# Patient Record
Sex: Female | Born: 1970 | Race: White | Hispanic: No | Smoking: Current some day smoker
Health system: Southern US, Community
[De-identification: ages and names within clinical notes are randomized; demographics above are authoritative.]

## PROBLEM LIST (undated history)

## (undated) DIAGNOSIS — G2581 Restless legs syndrome: Secondary | ICD-10-CM

## (undated) DIAGNOSIS — R471 Dysarthria and anarthria: Secondary | ICD-10-CM

## (undated) DIAGNOSIS — I10 Essential (primary) hypertension: Secondary | ICD-10-CM

## (undated) DIAGNOSIS — D329 Benign neoplasm of meninges, unspecified: Secondary | ICD-10-CM

## (undated) DIAGNOSIS — D332 Benign neoplasm of brain, unspecified: Secondary | ICD-10-CM

## (undated) DIAGNOSIS — E7800 Pure hypercholesterolemia, unspecified: Secondary | ICD-10-CM

## (undated) DIAGNOSIS — F4310 Post-traumatic stress disorder, unspecified: Secondary | ICD-10-CM

## (undated) DIAGNOSIS — F4000 Agoraphobia, unspecified: Secondary | ICD-10-CM

## (undated) DIAGNOSIS — E119 Type 2 diabetes mellitus without complications: Secondary | ICD-10-CM

## (undated) DIAGNOSIS — F410 Panic disorder [episodic paroxysmal anxiety] without agoraphobia: Secondary | ICD-10-CM

## (undated) DIAGNOSIS — F419 Anxiety disorder, unspecified: Secondary | ICD-10-CM

## (undated) DIAGNOSIS — R42 Dizziness and giddiness: Secondary | ICD-10-CM

## (undated) HISTORY — DX: Dysarthria and anarthria: R47.1

## (undated) HISTORY — PX: BACK SURGERY: SHX140

## (undated) HISTORY — PX: PARTIAL HYSTERECTOMY: SHX80

## (undated) HISTORY — DX: Benign neoplasm of meninges, unspecified: D32.9

## (undated) HISTORY — DX: Dizziness and giddiness: R42

---

## 2001-08-06 ENCOUNTER — Emergency Department (HOSPITAL_COMMUNITY): Admission: EM | Admit: 2001-08-06 | Discharge: 2001-08-06 | Payer: Self-pay | Admitting: Emergency Medicine

## 2001-11-04 ENCOUNTER — Encounter: Payer: Self-pay | Admitting: Emergency Medicine

## 2001-11-04 ENCOUNTER — Emergency Department (HOSPITAL_COMMUNITY): Admission: EM | Admit: 2001-11-04 | Discharge: 2001-11-04 | Payer: Self-pay | Admitting: Emergency Medicine

## 2002-05-20 ENCOUNTER — Emergency Department (HOSPITAL_COMMUNITY): Admission: EM | Admit: 2002-05-20 | Discharge: 2002-05-20 | Payer: Self-pay | Admitting: Emergency Medicine

## 2003-06-16 ENCOUNTER — Emergency Department (HOSPITAL_COMMUNITY): Admission: EM | Admit: 2003-06-16 | Discharge: 2003-06-16 | Payer: Self-pay | Admitting: Emergency Medicine

## 2003-06-19 ENCOUNTER — Ambulatory Visit (HOSPITAL_COMMUNITY): Admission: RE | Admit: 2003-06-19 | Discharge: 2003-06-19 | Payer: Self-pay | Admitting: Family Medicine

## 2004-05-19 ENCOUNTER — Emergency Department (HOSPITAL_COMMUNITY): Admission: EM | Admit: 2004-05-19 | Discharge: 2004-05-19 | Payer: Self-pay | Admitting: Emergency Medicine

## 2005-04-14 ENCOUNTER — Emergency Department (HOSPITAL_COMMUNITY): Admission: EM | Admit: 2005-04-14 | Discharge: 2005-04-14 | Payer: Self-pay | Admitting: Emergency Medicine

## 2005-05-05 ENCOUNTER — Emergency Department (HOSPITAL_COMMUNITY): Admission: EM | Admit: 2005-05-05 | Discharge: 2005-05-05 | Payer: Self-pay | Admitting: Emergency Medicine

## 2005-08-30 ENCOUNTER — Other Ambulatory Visit: Admission: RE | Admit: 2005-08-30 | Discharge: 2005-08-30 | Payer: Self-pay | Admitting: Obstetrics & Gynecology

## 2013-04-30 ENCOUNTER — Encounter (HOSPITAL_COMMUNITY): Payer: Self-pay | Admitting: Emergency Medicine

## 2013-04-30 ENCOUNTER — Emergency Department (HOSPITAL_COMMUNITY)
Admission: EM | Admit: 2013-04-30 | Discharge: 2013-04-30 | Disposition: A | Discharge status: Home / Self Care | Payer: BC Managed Care – PPO | Attending: Emergency Medicine | Admitting: Emergency Medicine

## 2013-04-30 DIAGNOSIS — IMO0002 Reserved for concepts with insufficient information to code with codable children: Secondary | ICD-10-CM | POA: Insufficient documentation

## 2013-04-30 DIAGNOSIS — Y92410 Unspecified street and highway as the place of occurrence of the external cause: Secondary | ICD-10-CM | POA: Insufficient documentation

## 2013-04-30 DIAGNOSIS — Y9389 Activity, other specified: Secondary | ICD-10-CM | POA: Insufficient documentation

## 2013-04-30 DIAGNOSIS — M549 Dorsalgia, unspecified: Secondary | ICD-10-CM

## 2013-04-30 HISTORY — DX: Agoraphobia, unspecified: F40.00

## 2013-04-30 HISTORY — DX: Post-traumatic stress disorder, unspecified: F43.10

## 2013-04-30 HISTORY — DX: Type 2 diabetes mellitus without complications: E11.9

## 2013-04-30 HISTORY — DX: Essential (primary) hypertension: I10

## 2013-04-30 HISTORY — DX: Benign neoplasm of brain, unspecified: D33.2

## 2013-04-30 HISTORY — DX: Panic disorder (episodic paroxysmal anxiety): F41.0

## 2013-04-30 HISTORY — DX: Pure hypercholesterolemia, unspecified: E78.00

## 2013-04-30 HISTORY — DX: Restless legs syndrome: G25.81

## 2013-04-30 HISTORY — DX: Anxiety disorder, unspecified: F41.9

## 2013-04-30 MED ORDER — METHOCARBAMOL 500 MG PO TABS: 500.0000 mg | ORAL_TABLET | Freq: Two times a day (BID) | ORAL | Status: DC

## 2013-04-30 MED ORDER — NAPROXEN 500 MG PO TABS: 500.0000 mg | ORAL_TABLET | Freq: Two times a day (BID) | ORAL | Status: DC

## 2013-04-30 NOTE — Progress Notes (Signed)
[facility] provided pt with a list of primary care resources.  °
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
## 2013-05-03 NOTE — ED Provider Notes (Signed)
Medical screening examination/treatment/procedure(s) were performed by non-physician practitioner and as supervising physician I was immediately available for consultation/collaboration.  EKG Interpretation   None         Rey Fors E Brieanna Nau, MD 05/03/13 0904 

## 2013-12-13 ENCOUNTER — Emergency Department (HOSPITAL_COMMUNITY): Payer: Medicare HMO

## 2013-12-13 ENCOUNTER — Emergency Department (HOSPITAL_COMMUNITY)
Admission: EM | Admit: 2013-12-13 | Discharge: 2013-12-13 | Disposition: A | Discharge status: Home / Self Care | Payer: Medicare HMO | Attending: Emergency Medicine | Admitting: Emergency Medicine

## 2013-12-13 ENCOUNTER — Encounter (HOSPITAL_COMMUNITY): Payer: Self-pay | Admitting: Emergency Medicine

## 2013-12-13 DIAGNOSIS — Z86011 Personal history of benign neoplasm of the brain: Secondary | ICD-10-CM | POA: Insufficient documentation

## 2013-12-13 DIAGNOSIS — R000 Tachycardia, unspecified: Secondary | ICD-10-CM | POA: Insufficient documentation

## 2013-12-13 DIAGNOSIS — F411 Generalized anxiety disorder: Secondary | ICD-10-CM | POA: Insufficient documentation

## 2013-12-13 DIAGNOSIS — Z79899 Other long term (current) drug therapy: Secondary | ICD-10-CM | POA: Insufficient documentation

## 2013-12-13 DIAGNOSIS — Z3202 Encounter for pregnancy test, result negative: Secondary | ICD-10-CM | POA: Insufficient documentation

## 2013-12-13 DIAGNOSIS — R002 Palpitations: Secondary | ICD-10-CM | POA: Insufficient documentation

## 2013-12-13 DIAGNOSIS — E119 Type 2 diabetes mellitus without complications: Secondary | ICD-10-CM | POA: Insufficient documentation

## 2013-12-13 DIAGNOSIS — R079 Chest pain, unspecified: Secondary | ICD-10-CM | POA: Insufficient documentation

## 2013-12-13 DIAGNOSIS — I10 Essential (primary) hypertension: Secondary | ICD-10-CM | POA: Insufficient documentation

## 2013-12-13 LAB — BASIC METABOLIC PANEL
ANION GAP: 15 (ref 5–15)
BUN: 11 mg/dL (ref 6–23)
CHLORIDE: 100 meq/L (ref 96–112)
CO2: 23 mEq/L (ref 19–32)
CREATININE: 0.63 mg/dL (ref 0.50–1.10)
Calcium: 9.8 mg/dL (ref 8.4–10.5)
GFR calc non Af Amer: >90 mL/min (ref >90)
Glucose, Bld: 272 mg/dL — ABNORMAL HIGH (ref 70–99)
Potassium: 3.4 mEq/L — ABNORMAL LOW (ref 3.7–5.3)
SODIUM: 138 meq/L (ref 137–147)

## 2013-12-13 LAB — POC URINE PREG, ED: Preg Test, Ur: NEGATIVE

## 2013-12-13 LAB — CBC
HCT: 41.8 % (ref 36.0–46.0)
Hemoglobin: 14.2 g/dL (ref 12.0–15.0)
MCH: 29.5 pg (ref 26.0–34.0)
MCHC: 34 g/dL (ref 30.0–36.0)
MCV: 86.9 fL (ref 78.0–100.0)
Platelets: 222 10*3/uL (ref 150–400)
RBC: 4.81 MIL/uL (ref 3.87–5.11)
RDW: 13.3 % (ref 11.5–15.5)
WBC: 10.8 10*3/uL — AB (ref 4.0–10.5)

## 2013-12-13 NOTE — ED Notes (Addendum)
RN went to D/C pt, and pt had multiple questions for the doctor. MD made aware.

## 2013-12-13 NOTE — ED Provider Notes (Signed)
CSN: [id number]     Arrival date & time 12/13/13  1536 History   First MD Initiated Contact with Patient 12/13/13 1601     Chief Complaint  Patient presents with  . Tachycardia     (Consider location/radiation/quality/duration/timing/severity/associated sxs/prior Treatment) Patient is a 43 y.o. female presenting with palpitations. The history is provided by the patient.  Palpitations Palpitations quality:  Irregular Onset quality:  Sudden Timing:  Constant Progression:  Unchanged Chronicity:  Chronic Context: not caffeine   Relieved by:  Nothing Worsened by:  Nothing tried Associated symptoms: chest pain (lasting seconds, occasional, L sided, sharp, mild SOB at that time also)   Associated symptoms: no cough, no diaphoresis, no dizziness, no near-syncope, no shortness of breath and no vomiting     Past Medical History  Diagnosis Date  . Diabetes mellitus without complication   . Hypertension   . High cholesterol   . PTSD (post-traumatic stress disorder)   . Agoraphobia   . Panic disorder   . Anxiety   . Brain tumor (benign)   . Restless leg syndrome    History reviewed. No pertinent past surgical history. Family History  Problem Relation Age of Onset  . Cancer Mother   . Hypertension Mother   . Diabetes Mother   . Glaucoma Father   . Heart failure Father    History  Substance Use Topics  . Smoking status: Never Smoker   . Smokeless tobacco: Never Used  . Alcohol Use: No   OB History   Grav Para Term Preterm Abortions TAB SAB Ect Mult Living                 Review of Systems  Constitutional: Negative for fever, chills and diaphoresis.  Respiratory: Negative for cough and shortness of breath.   Cardiovascular: Positive for chest pain (lasting seconds, occasional, L sided, sharp, mild SOB at that time also) and palpitations. Negative for near-syncope.  Gastrointestinal: Negative for vomiting.  Neurological: Negative for dizziness.  All other systems reviewed  and are negative.     Allergies  Review of patient's allergies indicates no known allergies.  Home Medications   Prior to Admission medications   Medication Sig Start Date End Date Taking? Authorizing Provider  atenolol (TENORMIN) 50 MG tablet Take 50 mg by mouth daily.    Historical Provider, MD  carbamazepine (TEGRETOL) 200 MG tablet Take 200 mg by mouth at bedtime.    Historical Provider, MD  glipiZIDE (GLUCOTROL XL) 2.5 MG 24 hr tablet Take 2.5 mg by mouth daily with breakfast.    Historical Provider, MD  ibuprofen (ADVIL,MOTRIN) 200 MG tablet Take 400 mg by mouth every 6 (six) hours as needed.    Historical Provider, MD  lisinopril (PRINIVIL,ZESTRIL) 5 MG tablet Take 5 mg by mouth daily.    Historical Provider, MD  metFORMIN (GLUCOPHAGE) 1000 MG tablet Take 1,000 mg by mouth 2 (two) times daily with a meal.    Historical Provider, MD   BP 174/83  Pulse 107  Temp(Src) 98.7 F (37.1 C) (Oral)  Resp 18  SpO2 97%  LMP 12/09/2013 Physical Exam  Nursing note and vitals reviewed. Constitutional: She is oriented to person, place, and time. She appears well-developed and well-nourished. No distress.  HENT:  Head: Normocephalic and atraumatic.  Eyes: EOM are normal. Pupils are equal, round, and reactive to light.  Neck: Normal range of motion. Neck supple.  Cardiovascular: Regular rhythm.  Tachycardia present.  Exam reveals no friction rub.   No  murmur heard. Pulmonary/Chest: Effort normal and breath sounds normal. No respiratory distress. She has no wheezes. She has no rales.  Abdominal: Soft. She exhibits no distension. There is no tenderness. There is no rebound.  Musculoskeletal: Normal range of motion. She exhibits no edema.  Neurological: She is alert and oriented to person, place, and time.  Skin: She is not diaphoretic.    ED Course  Procedures (including critical care time) Labs Review Labs Reviewed  CBC - Abnormal; Notable for the following:    WBC 10.8 (*)    All  other components within normal limits  BASIC METABOLIC PANEL  I-STAT TROPOININ, ED  POC URINE PREG, ED    Imaging Review Dg Chest 2 View  12/13/2013   CLINICAL DATA:  Chest pain.  EXAM: CHEST  2 VIEW  COMPARISON:  Prior radiograph from 08/04/2010  FINDINGS: The cardiac and mediastinal silhouettes are stable in size and contour, and remain within normal limits.  The lungs are normally inflated. No airspace consolidation, pleural effusion, or pulmonary edema is identified. There is no pneumothorax.  No acute osseous abnormality identified.  IMPRESSION: No active cardiopulmonary disease.   Electronically Signed   By: Jeannine Boga M.D.   On: 12/13/2013 17:41     EKG Interpretation   Date/Time:  Thursday December 13 2013 15:49:45 EDT Ventricular Rate:  111 PR Interval:  156 QRS Duration: 74 QT Interval:  352 QTC Calculation: 478 R Axis:   15 Text Interpretation:  Sinus tachycardia Nonspecific T abnrm, anterolateral  leads Similar to prior Confirmed by Mingo Amber  MD, [place] ([id number]) on 12/13/2013  4:03:05 PM      MDM   Final diagnoses:  Tachycardia    43 year old female with history of A. fib, multiple psychiatric disorders, hypertension, diabetes presents with palpitations. Present today. States some mild disorientation during this time. No dizziness or ataxia. No nausea or vomiting. Has a few episodes of chest pain, lasting seconds with mild first of breath associated that time. Irregular bouts of chest pain. Has been off of her meds, atenolol, all her diabetes meds for about 9 months. EKG with sinus tach on the monitor in sinus rhythm. Denies any caffeine, was a drug use, stimulants. She is nervous in the room, but hasn't says she does not want anything for anxiety. Vitals otherwise stable. With her fleeting chest pain, this is not consistent with PE. No persistent chest pain or shortness of breath. Not on estrogen. Will check basic labs, chest x-ray.  Labs ok. CXR ok. Stable for  discharge, given resource guide to establish a PCP.  Osvaldo Shipper, MD 12/13/13 [id number]

## 2013-12-13 NOTE — ED Notes (Signed)
Per pt, states she can feel heart beat hitting "back of chest"-states she has been diagnosed with HTN, Hyperlipidemia and not currently taking meds

## 2013-12-13 NOTE — Discharge Instructions (Signed)
Nonspecific Tachycardia Tachycardia is a faster than normal heartbeat (more than 100 beats per minute). In adults, the heart normally beats between 60 and 100 times a minute. A fast heartbeat may be a normal response to exercise or stress. It does not necessarily mean that something is wrong. However, sometimes when your heart beats too fast it may not be able to pump enough blood to the rest of your body. This can result in chest pain, shortness of breath, dizziness, and even fainting. Nonspecific tachycardia means that the specific cause or pattern of your tachycardia is unknown. CAUSES  Tachycardia may be harmless or it may be due to a more serious underlying cause. Possible causes of tachycardia include:  Exercise or exertion.  Fever.  Pain or injury.  Infection.  Loss of body fluids (dehydration).  Overactive thyroid.  Lack of red blood cells (anemia).  Anxiety and stress.  Alcohol.  Caffeine.  Tobacco products.  Diet pills.  Illegal drugs.  Heart disease. SYMPTOMS  Rapid or irregular heartbeat (palpitations).  Suddenly feeling your heart beating (cardiac awareness).  Dizziness.  Tiredness (fatigue).  Shortness of breath.  Chest pain.  Nausea.  Fainting. DIAGNOSIS  Your caregiver will perform a physical exam and take your medical history. In some cases, a heart specialist (cardiologist) may be consulted. Your caregiver may also order:  Blood tests.  Electrocardiography. This test records the electrical activity of your heart.  A heart monitoring test. TREATMENT  Treatment will depend on the likely cause of your tachycardia. The goal is to treat the underlying cause of your tachycardia. Treatment methods may include:  Replacement of fluids or blood through an intravenous (IV) tube for moderate to severe dehydration or anemia.  New medicines or changes in your current medicines.  Diet and lifestyle changes.  Treatment for certain  infections.  Stress relief or relaxation methods. HOME CARE INSTRUCTIONS   Rest.  Drink enough fluids to keep your urine clear or pale yellow.  Do not smoke.  Avoid:  Caffeine.  Tobacco.  Alcohol.  Chocolate.  Stimulants such as over-the-counter diet pills or pills that help you stay awake.  Situations that cause anxiety or stress.  Illegal drugs such as marijuana, phencyclidine (PCP), and cocaine.  Only take medicine as directed by your caregiver.  Keep all follow-up appointments as directed by your caregiver. SEEK IMMEDIATE MEDICAL CARE IF:   You have pain in your chest, upper arms, jaw, or neck.  You become weak, dizzy, or feel faint.  You have palpitations that will not go away.  You vomit, have diarrhea, or pass blood in your stool.  Your skin is cool, pale, and wet.  You have a fever that will not go away with rest, fluids, and medicine. MAKE SURE YOU:   Understand these instructions.  Will watch your condition.  Will get help right away if you are not doing well or get worse. Document Released: 06/24/2004 Document Revised: 08/09/2011 Document Reviewed: 04/27/2011 [facility] Patient Information 2015 [place], [place]. This information is not intended to replace advice given to you by your health care provider. Make sure you discuss any questions you have with your health care provider.   Emergency Department Resource Guide 1) Find a Doctor and Pay Out of Pocket Although you won't have to find out who is covered by your insurance plan, it is a good idea to ask around and get recommendations. You will then need to call the office and see if the doctor you have chosen will accept  you as a new patient and what types of options they offer for patients who are self-pay. Some doctors offer discounts or will set up payment plans for their patients who do not have insurance, but you will need to ask so you aren't surprised when you get to your appointment.  2)  Contact Your Local Health Department Not all health departments have doctors that can see patients for sick visits, but many do, so it is worth a call to see if yours does. If you don't know where your local health department is, you can check in your phone book. The CDC also has a tool to help you locate your state's health department, and many state websites also have listings of all of their local health departments.  3) Find a [facility] If your illness is not likely to be very severe or complicated, you may want to try a walk in clinic. These are popping up all over the country in pharmacies, drugstores, and shopping centers. They're usually staffed by nurse practitioners or physician assistants that have been trained to treat common illnesses and complaints. They're usually fairly quick and inexpensive. However, if you have serious medical issues or chronic medical problems, these are probably not your best option.  No Primary Care Doctor: - Call Health Connect at  [phone] - they can help you locate a primary care doctor that  accepts your insurance, provides certain services, etc. - Physician Referral Service- [phone]  Chronic Pain Problems: Organization         Address  Phone   Notes  [facility]  [phone] Patients need to be referred by their primary care doctor.   Medication Assistance: Organization         Address  Phone   Notes  [facility] Medication [facility] [address] [place], [place] [address] [phone] --Must be a resident of [facility] -- Must have NO insurance coverage whatsoever (no Medicaid/ Medicare, etc.) -- The pt. MUST have a primary care doctor that directs their care regularly and follows them in the community   MedAssist  [phone]   [employer]  3674707209    Agencies that provide inexpensive medical care: Organization         Address  Phone   Notes  [place]   [phone]   [facility]    [phone]   [facility] [place], [place] [address] [phone]   [place] [address] [place] [phone]   Planned Parenthood    [phone]   [facility]    [phone]   [place] and [place] [address], [place] Phone:  [phone], Fax:  [phone] Hours of Operation:  9 am - 6 pm, M-F.  Also accepts Medicaid/Medicare and self-pay.  [facility] for [address] [place], [address], [place] Phone: [phone], Fax: [phone]. Hours of Operation:  8:30 am - 5:30 pm, M-F.  Also accepts Medicaid and self-pay.  [facility] [place] [address] [place] Phone: [phone]   [place], [place], [place] [phone], Ext. [phone] Mondays & Thursdays: 7-9 AM.  First 15 patients are seen on a first come, first serve basis.    [place] Providers:  Organization  Address  Phone   Notes  [facility] [address], [place] [phone] Also accepts self-pay patients.  [facility] [address], [place]  [phone]   [place], [address], [place] [phone]   [facility] [address], [place] [phone]   Lucianne Lei [address] [place] [address], [place]   [phone] Only accepts [place] Access [place] patients after they have their name applied to their card.   Self-Pay (no insurance) in [facility]:  Organization         Address  Phone   Notes  Sickle Cell Patients, [facility], [facility], [facility] [place] [phone]   [facility] [place] [phone]   [facility] [place]  [place], [place], [place] [phone]   [facility]/Dr. Osei-Bonsu  [address], [place] or [address], [place] [phone] Phone number for both [place] and [place] locations is the same.  Urgent Medical and [facility] [address], [place] [phone]   [facility] [address], [place] or [address] [phone]   [facility] [place] [address], [place] [phone], phone; [phone], fax Sees patients 1st and 3rd Saturday of every month.  Must not qualify for public or private insurance (i.e. Medicaid, Medicare, [place] Health Choice, Veterans' Benefits)  Household income should be no more than 200% of the poverty level The clinic cannot treat you if you are pregnant or think you are pregnant  Sexually transmitted diseases are not treated at the clinic.    Dental Care: Organization         Address  Phone  Notes  [facility] Department of [facility] [place] [phone] Accepts children up to age 37 who are enrolled in [place] or [place]; pregnant women with a Medicaid card; and children who have applied for Medicaid or [place] Health Choice, but were declined, whose parents can pay a reduced fee at time of service.  [facility] Department of [facility]  [address], [place] [phone] Accepts children up to age 64 who are enrolled in [place] or [place]; pregnant women with a Medicaid card; and children who have applied for Medicaid or [place] Health Choice, but were declined, whose parents can pay a reduced fee at time of service.  [facility] Adult [facility] Access PROGRAM  [place] [phone] Patients are seen by appointment only. Walk-ins are not accepted. [place] will see patients 44 years of age and older. Monday - Tuesday  (8am-5pm) Most Wednesdays (8:30-5pm) $30 per visit, cash only  [facility] Adult Dental Access PROGRAM  [address], [facility] [phone] Patients are seen by appointment only. Walk-ins are not accepted. [place] will see patients 48 years of age and older. One Wednesday Evening (Monthly: Volunteer Based).  $30 per visit, cash only  [place]  [phone] for adults; Children under age 65, call Graduate Pediatric Dentistry at [phone]. Children aged 53-14, please call [phone] to request a pediatric application.  Dental services are provided in all areas of dental care including  fillings, crowns and bridges, complete and partial dentures, implants, gum treatment, root canals, and extractions. Preventive care is also provided. Treatment is provided to both adults and children. Patients are selected via a lottery and there is often a waiting list.   [facility] [address], [place]  [phone] [email or web address]   [facility] [address] [place], [place] [phone], Ext. [phone] Second and Fourth Thursday of each month, opens at 6:30 AM; Clinic ends at 9 AM.  Patients are seen on a first-come first-served basis, and a limited number are seen during each clinic.   [facility]  [address] Hillard Danker [place], [place] [phone]   Eligibility Requirements You must have lived in [place], [place], or [place] counties for at least the last three months.   You cannot be eligible for state or federal sponsored [employer], including [employer], [place], or [employer].   You generally cannot be eligible for healthcare insurance through your employer.    How to apply: Eligibility screenings are held every Tuesday and Wednesday afternoon from 1:00 pm until 4:00 pm. You do not need an appointment for the interview!  [facility] [address], [place], [place]   [place]  [place] Department  [place]  [phone]    Behavioral Health Resources in the Community: Intensive Outpatient Programs Organization         Address  Phone  Notes  [place]. [address] [place], [place] [phone]   [facility] Outpatient [address], [place], [place]   ADS: Alcohol & Drug Svcs [address] [place], [place]   [place] [address]  [place], [place] or [phone]   Substance Abuse Resources Organization         Address  Phone  Notes  Alcohol and Drug Services  [phone]   [place]  [phone]   [place]   Chinita Pester  [phone]   Residential & Outpatient Substance Abuse Program  [phone]   Psychological Services Organization         Address  Phone  Notes  [facility] [place]  [place]  [phone]   [place] [address] [place] or [phone]    Mobile Crisis Teams Organization         Address  Phone  Notes  [facility], Mobile Crisis Care Unit  [phone]   [facility]ive Psychotherapeutic Services  [address] [place], [place]   [place] [address], [place] [phone]    Self-Help/Support Groups Organization         Address  Phone             Notes  [place]. of [place] - variety of support groups  [place] Call for more information  Narcotics Anonymous (NA), [facility] [address], [employer] [place]  2 meetings at this location   Special educational needs teacher         Address  Phone  Notes  ASAP Residential Treatment [place],    [place]  [place]  [address], [place] [phone], [place], [place]   [place],  [place] [phone] Admissions: 8am-3pm M-F  Incentives Substance [place] [address]    [place], [place] [phone]   [facility] [address] [place], [place], [place]   [facility] [address]  [place], [place]   [facility]ight Programs - Intensive Outpatient [address] Kristeen [address], [place], [place]   [facility] ([place].) [place].,  [place], [place] [phone] or [phone]   [facility]idential Treatment Services (RTS) [address] [place], [place] Accepts Medicaid  [facility] [place] [address]  [place] [phone] Substance Abuse/Addiction Treatment   [facility] Organization         Address  Phone  Notes  [facility]  [phone]   Domenic Schwab, PhD [address] Arlis Porta [place], [place]   [phone] or [phone]   [place], [place] [phone]   [facility] [address], [place], [place] [phone] Insurance/Medicaid/sponsorship through [facility] and Families [address]                                    [place], [place] [phone] [place] [address], [place] [phone]    Dr. Adele Schilder  [phone]   [facility] Dept. 1) [address] [place] 2) [place] 3)  [place] [address], [place] [phone]  [phone]   [place] [phone] or [phone] (After Hours)

## 2013-12-14 LAB — I-STAT TROPONIN, ED: Troponin i, poc: 0.02 ng/mL (ref 0.00–0.08)

## 2014-10-05 ENCOUNTER — Emergency Department (INDEPENDENT_AMBULATORY_CARE_PROVIDER_SITE_OTHER)
Admission: EM | Admit: 2014-10-05 | Discharge: 2014-10-05 | Disposition: A | Discharge status: Home / Self Care | Payer: Commercial Managed Care - HMO | Source: Home / Self Care | Attending: Family Medicine | Admitting: Family Medicine

## 2014-10-05 DIAGNOSIS — J069 Acute upper respiratory infection, unspecified: Secondary | ICD-10-CM | POA: Diagnosis not present

## 2014-10-05 DIAGNOSIS — B9789 Other viral agents as the cause of diseases classified elsewhere: Principal | ICD-10-CM

## 2014-10-05 MED ORDER — BENZONATATE 100 MG PO CAPS: 100.0000 mg | ORAL_CAPSULE | Freq: Three times a day (TID) | ORAL | Status: DC | PRN

## 2014-10-05 MED ORDER — LEVOCETIRIZINE DIHYDROCHLORIDE 5 MG PO TABS: 5.0000 mg | ORAL_TABLET | Freq: Every evening | ORAL | Status: DC

## 2014-10-05 MED ORDER — IBUPROFEN 600 MG PO TABS: 600.0000 mg | ORAL_TABLET | Freq: Three times a day (TID) | ORAL | Status: DC | PRN

## 2014-10-05 NOTE — ED Provider Notes (Signed)
CSN: [id number]     Arrival date & time 10/05/14  1517 History   First MD Initiated Contact with Patient 10/05/14 1607     Chief Complaint  Patient presents with  . Recurrent Sinusitis  . Nasal Congestion    HPI   Diamond Brown is a 44 y.o. female complaining of nasal blockage that started 7 days ago.  Associated symptoms include runny nose, cough, sore throat and headache today, and she denies fever, sore throat and difficulty breathing.The patient symptoms are worsening. Treatments tried thus far include cough suppressant of choice with fair  relief. She has also tired Claritin with mild relief. She reports sick contacts at her work.  She has had her flu shot. She denies a history of asthma.    Past Medical History  Diagnosis Date  . Diabetes mellitus without complication   . Hypertension   . High cholesterol   . PTSD (post-traumatic stress disorder)   . Agoraphobia   . Panic disorder   . Anxiety   . Brain tumor (benign)   . Restless leg syndrome    No past surgical history on file. Family History  Problem Relation Age of Onset  . Cancer Mother   . Hypertension Mother   . Diabetes Mother   . Glaucoma Father   . Heart failure Father    History  Substance Use Topics  . Smoking status: Never Smoker   . Smokeless tobacco: Never Used  . Alcohol Use: No   OB History    No data available     Review of Systems  Constitutional: Negative for chills, diaphoresis, activity change, appetite change and fatigue.  HENT: Positive for congestion, sneezing and sore throat. Negative for ear discharge, facial swelling, hearing loss, sinus pressure, trouble swallowing and voice change.   Cardiovascular: Negative for chest pain.  Gastrointestinal: Negative for abdominal distention.  Genitourinary: Negative for flank pain.  Musculoskeletal: Positive for myalgias.  Neurological: Negative for dizziness.    Allergies  Review of patient's allergies indicates no known  allergies.  Home Medications   Prior to Admission medications   Medication Sig Start Date End Date Taking? Authorizing Provider  atenolol (TENORMIN) 50 MG tablet Take 50 mg by mouth daily.    Historical Provider, MD  benzonatate (TESSALON) 100 MG capsule Take 1-2 capsules (100-200 mg total) by mouth 3 (three) times daily as needed for cough. 10/05/14   Tereasa Coop, PA-C  bimatoprost (LUMIGAN) 0.03 % ophthalmic solution Place 1 drop into both eyes at bedtime.    Historical Provider, MD  brimonidine-timolol (COMBIGAN) 0.2-0.5 % ophthalmic solution Place 1 drop into both eyes every 12 (twelve) hours.    Historical Provider, MD  carbamazepine (TEGRETOL) 200 MG tablet Take 200 mg by mouth at bedtime.    Historical Provider, MD  citalopram (CELEXA) 40 MG tablet Take 40 mg by mouth daily.    Historical Provider, MD  haloperidol (HALDOL) 1 MG tablet Take 1 mg by mouth daily.    Historical Provider, MD  ibuprofen (ADVIL,MOTRIN) 600 MG tablet Take 1 tablet (600 mg total) by mouth every 8 (eight) hours as needed for fever, headache or moderate pain. 10/05/14   Tereasa Coop, PA-C  levocetirizine (XYZAL) 5 MG tablet Take 1 tablet (5 mg total) by mouth every evening. 10/05/14   Tereasa Coop, PA-C  metFORMIN (GLUCOPHAGE) 1000 MG tablet Take 1,000 mg by mouth 2 (two) times daily with a meal.    Historical Provider, MD   BP  140/94 mmHg  Pulse 84  Temp(Src) 99.4 F (37.4 C) (Oral)  Resp 16  SpO2 95% Physical Exam  Constitutional: She is oriented to person, place, and time. She appears well-developed and well-nourished.  HENT:  Head: Normocephalic.  Right Ear: External ear normal.  Left Ear: External ear normal.  Mouth/Throat: No oropharyngeal exudate.  Eyes: Conjunctivae and EOM are normal. Pupils are equal, round, and reactive to light. Right eye exhibits no discharge. Left eye exhibits no discharge.  Neck: No tracheal deviation present.  Cardiovascular: Normal rate, regular rhythm and normal  heart sounds.   Pulmonary/Chest: Effort normal and breath sounds normal.  Abdominal: Soft. Bowel sounds are normal.  Lymphadenopathy:    She has no cervical adenopathy.  Neurological: She is alert and oriented to person, place, and time. No cranial nerve deficit.  Skin: Skin is warm and dry.  Psychiatric: She has a normal mood and affect. Her behavior is normal. Judgment and thought content normal.  Nursing note and vitals reviewed.     ED Course  Procedures (including critical care time) Labs Review Labs Reviewed - No data to display  Imaging Review No results found.   MDM   1. Viral URI with cough    Diamond Brown is a never smoker female here today complaining of sore throat, sinus congestion, and cough present 7 days.  She feels overall that her symptoms are getting better.  Will treat for viral uri, allergies and cough.    Tereasa Coop, PA-C 10/05/14 [phone]

## 2014-10-05 NOTE — ED Notes (Signed)
Pt comes in with c/o recurrent sinusitis/bronchitis s/p Doxycycline treatment 2 weeks ago diagnosed in [facility] C/o freq cough with greenish phlegm, post nasal drainage and congestion moving to chest Denies chest pain Slight chills Pt has tried otc Dayquil, Cold/cough medication

## 2014-10-05 NOTE — ED Notes (Signed)
Diamond Brown , rn,had computer issues earlier, and used this Probation officer 's code to chart

## 2015-12-10 ENCOUNTER — Ambulatory Visit: Payer: Commercial Managed Care - HMO | Admitting: Podiatry

## 2015-12-24 ENCOUNTER — Ambulatory Visit: Payer: Commercial Managed Care - HMO | Admitting: Podiatry

## 2016-04-07 DIAGNOSIS — E782 Mixed hyperlipidemia: Secondary | ICD-10-CM | POA: Diagnosis not present

## 2016-04-07 DIAGNOSIS — E1165 Type 2 diabetes mellitus with hyperglycemia: Secondary | ICD-10-CM | POA: Diagnosis not present

## 2016-04-14 DIAGNOSIS — I10 Essential (primary) hypertension: Secondary | ICD-10-CM | POA: Diagnosis not present

## 2016-04-14 DIAGNOSIS — E782 Mixed hyperlipidemia: Secondary | ICD-10-CM | POA: Diagnosis not present

## 2016-04-14 DIAGNOSIS — E1165 Type 2 diabetes mellitus with hyperglycemia: Secondary | ICD-10-CM | POA: Diagnosis not present

## 2016-04-14 DIAGNOSIS — Z23 Encounter for immunization: Secondary | ICD-10-CM | POA: Diagnosis not present

## 2016-04-15 DIAGNOSIS — H401131 Primary open-angle glaucoma, bilateral, mild stage: Secondary | ICD-10-CM | POA: Diagnosis not present

## 2016-04-15 DIAGNOSIS — H2511 Age-related nuclear cataract, right eye: Secondary | ICD-10-CM | POA: Diagnosis not present

## 2016-04-15 DIAGNOSIS — H02839 Dermatochalasis of unspecified eye, unspecified eyelid: Secondary | ICD-10-CM | POA: Diagnosis not present

## 2016-04-15 DIAGNOSIS — H527 Unspecified disorder of refraction: Secondary | ICD-10-CM | POA: Diagnosis not present

## 2016-05-05 DIAGNOSIS — Z01419 Encounter for gynecological examination (general) (routine) without abnormal findings: Secondary | ICD-10-CM | POA: Diagnosis not present

## 2016-05-05 DIAGNOSIS — N920 Excessive and frequent menstruation with regular cycle: Secondary | ICD-10-CM | POA: Diagnosis not present

## 2016-05-05 DIAGNOSIS — Z1231 Encounter for screening mammogram for malignant neoplasm of breast: Secondary | ICD-10-CM | POA: Diagnosis not present

## 2016-08-18 DIAGNOSIS — I10 Essential (primary) hypertension: Secondary | ICD-10-CM | POA: Diagnosis not present

## 2016-08-18 DIAGNOSIS — E782 Mixed hyperlipidemia: Secondary | ICD-10-CM | POA: Diagnosis not present

## 2016-08-18 DIAGNOSIS — E1165 Type 2 diabetes mellitus with hyperglycemia: Secondary | ICD-10-CM | POA: Diagnosis not present

## 2016-08-25 DIAGNOSIS — Z0000 Encounter for general adult medical examination without abnormal findings: Secondary | ICD-10-CM | POA: Diagnosis not present

## 2016-08-25 DIAGNOSIS — I10 Essential (primary) hypertension: Secondary | ICD-10-CM | POA: Diagnosis not present

## 2016-08-25 DIAGNOSIS — E782 Mixed hyperlipidemia: Secondary | ICD-10-CM | POA: Diagnosis not present

## 2016-08-25 DIAGNOSIS — E1165 Type 2 diabetes mellitus with hyperglycemia: Secondary | ICD-10-CM | POA: Diagnosis not present

## 2016-10-28 DIAGNOSIS — H401131 Primary open-angle glaucoma, bilateral, mild stage: Secondary | ICD-10-CM | POA: Diagnosis not present

## 2017-03-02 DIAGNOSIS — E1165 Type 2 diabetes mellitus with hyperglycemia: Secondary | ICD-10-CM | POA: Diagnosis not present

## 2017-03-02 DIAGNOSIS — E782 Mixed hyperlipidemia: Secondary | ICD-10-CM | POA: Diagnosis not present

## 2017-03-09 DIAGNOSIS — I10 Essential (primary) hypertension: Secondary | ICD-10-CM | POA: Diagnosis not present

## 2017-03-09 DIAGNOSIS — E1165 Type 2 diabetes mellitus with hyperglycemia: Secondary | ICD-10-CM | POA: Diagnosis not present

## 2017-03-09 DIAGNOSIS — Z23 Encounter for immunization: Secondary | ICD-10-CM | POA: Diagnosis not present

## 2017-03-09 DIAGNOSIS — E782 Mixed hyperlipidemia: Secondary | ICD-10-CM | POA: Diagnosis not present

## 2017-04-29 DIAGNOSIS — H401133 Primary open-angle glaucoma, bilateral, severe stage: Secondary | ICD-10-CM | POA: Diagnosis not present

## 2017-05-11 DIAGNOSIS — Z1231 Encounter for screening mammogram for malignant neoplasm of breast: Secondary | ICD-10-CM | POA: Diagnosis not present

## 2017-05-11 DIAGNOSIS — Z01419 Encounter for gynecological examination (general) (routine) without abnormal findings: Secondary | ICD-10-CM | POA: Diagnosis not present

## 2017-06-22 DIAGNOSIS — N390 Urinary tract infection, site not specified: Secondary | ICD-10-CM | POA: Diagnosis not present

## 2017-06-22 DIAGNOSIS — N926 Irregular menstruation, unspecified: Secondary | ICD-10-CM | POA: Diagnosis not present

## 2017-07-27 DIAGNOSIS — N920 Excessive and frequent menstruation with regular cycle: Secondary | ICD-10-CM | POA: Diagnosis not present

## 2017-08-31 DIAGNOSIS — E1165 Type 2 diabetes mellitus with hyperglycemia: Secondary | ICD-10-CM | POA: Diagnosis not present

## 2017-09-07 DIAGNOSIS — E782 Mixed hyperlipidemia: Secondary | ICD-10-CM | POA: Diagnosis not present

## 2017-09-07 DIAGNOSIS — D320 Benign neoplasm of cerebral meninges: Secondary | ICD-10-CM | POA: Diagnosis not present

## 2017-09-07 DIAGNOSIS — E1165 Type 2 diabetes mellitus with hyperglycemia: Secondary | ICD-10-CM | POA: Diagnosis not present

## 2017-09-07 DIAGNOSIS — I10 Essential (primary) hypertension: Secondary | ICD-10-CM | POA: Diagnosis not present

## 2017-09-07 DIAGNOSIS — Z0000 Encounter for general adult medical examination without abnormal findings: Secondary | ICD-10-CM | POA: Diagnosis not present

## 2017-09-08 ENCOUNTER — Other Ambulatory Visit: Payer: Self-pay | Admitting: Internal Medicine

## 2017-09-08 DIAGNOSIS — D320 Benign neoplasm of cerebral meninges: Secondary | ICD-10-CM

## 2017-09-26 ENCOUNTER — Ambulatory Visit
Admission: RE | Admit: 2017-09-26 | Discharge: 2017-09-26 | Disposition: A | Discharge status: Home / Self Care | Payer: Medicare Other | Source: Ambulatory Visit | Attending: Internal Medicine | Admitting: Internal Medicine

## 2017-09-26 ENCOUNTER — Other Ambulatory Visit: Payer: Commercial Managed Care - HMO

## 2017-09-26 DIAGNOSIS — D320 Benign neoplasm of cerebral meninges: Secondary | ICD-10-CM | POA: Diagnosis not present

## 2017-10-31 DIAGNOSIS — H401132 Primary open-angle glaucoma, bilateral, moderate stage: Secondary | ICD-10-CM | POA: Diagnosis not present

## 2018-03-09 ENCOUNTER — Other Ambulatory Visit: Payer: Self-pay

## 2018-03-09 NOTE — Patient Outreach (Signed)
[facility]) Care Management  03/09/2018  LATERIA ALDERMAN [birth year] [id number]   Medication Adherence call to Mrs. J[place] left a message for patient to call back patient is due on Lovastatin 40 mg under [place].   [place] Management Direct Dial [phone]  Fax [phone] [email or web address]

## 2018-03-22 DIAGNOSIS — E782 Mixed hyperlipidemia: Secondary | ICD-10-CM | POA: Diagnosis not present

## 2018-03-22 DIAGNOSIS — I10 Essential (primary) hypertension: Secondary | ICD-10-CM | POA: Diagnosis not present

## 2018-03-22 DIAGNOSIS — E1165 Type 2 diabetes mellitus with hyperglycemia: Secondary | ICD-10-CM | POA: Diagnosis not present

## 2018-03-29 DIAGNOSIS — Z23 Encounter for immunization: Secondary | ICD-10-CM | POA: Diagnosis not present

## 2018-03-29 DIAGNOSIS — E1165 Type 2 diabetes mellitus with hyperglycemia: Secondary | ICD-10-CM | POA: Diagnosis not present

## 2018-03-29 DIAGNOSIS — I10 Essential (primary) hypertension: Secondary | ICD-10-CM | POA: Diagnosis not present

## 2018-03-29 DIAGNOSIS — E782 Mixed hyperlipidemia: Secondary | ICD-10-CM | POA: Diagnosis not present

## 2018-05-05 DIAGNOSIS — H401132 Primary open-angle glaucoma, bilateral, moderate stage: Secondary | ICD-10-CM | POA: Diagnosis not present

## 2018-05-05 DIAGNOSIS — H2513 Age-related nuclear cataract, bilateral: Secondary | ICD-10-CM | POA: Diagnosis not present

## 2018-05-18 DIAGNOSIS — Z1231 Encounter for screening mammogram for malignant neoplasm of breast: Secondary | ICD-10-CM | POA: Diagnosis not present

## 2018-06-27 DIAGNOSIS — J069 Acute upper respiratory infection, unspecified: Secondary | ICD-10-CM | POA: Diagnosis not present

## 2018-06-27 DIAGNOSIS — E1165 Type 2 diabetes mellitus with hyperglycemia: Secondary | ICD-10-CM | POA: Diagnosis not present

## 2018-07-11 DIAGNOSIS — I10 Essential (primary) hypertension: Secondary | ICD-10-CM | POA: Diagnosis not present

## 2018-07-11 DIAGNOSIS — R110 Nausea: Secondary | ICD-10-CM | POA: Diagnosis not present

## 2018-07-11 DIAGNOSIS — R197 Diarrhea, unspecified: Secondary | ICD-10-CM | POA: Diagnosis not present

## 2018-07-12 DIAGNOSIS — R197 Diarrhea, unspecified: Secondary | ICD-10-CM | POA: Diagnosis not present

## 2018-07-14 ENCOUNTER — Other Ambulatory Visit: Payer: Self-pay | Admitting: Internal Medicine

## 2018-07-14 DIAGNOSIS — R109 Unspecified abdominal pain: Secondary | ICD-10-CM

## 2018-07-31 ENCOUNTER — Other Ambulatory Visit: Payer: Medicare Other

## 2018-08-07 ENCOUNTER — Ambulatory Visit
Admission: RE | Admit: 2018-08-07 | Discharge: 2018-08-07 | Disposition: A | Discharge status: Home / Self Care | Payer: Medicare Other | Source: Ambulatory Visit | Attending: Internal Medicine | Admitting: Internal Medicine

## 2018-08-07 DIAGNOSIS — K7689 Other specified diseases of liver: Secondary | ICD-10-CM | POA: Diagnosis not present

## 2018-08-07 DIAGNOSIS — N281 Cyst of kidney, acquired: Secondary | ICD-10-CM | POA: Diagnosis not present

## 2018-08-07 DIAGNOSIS — R109 Unspecified abdominal pain: Secondary | ICD-10-CM

## 2018-09-14 DIAGNOSIS — E1165 Type 2 diabetes mellitus with hyperglycemia: Secondary | ICD-10-CM | POA: Diagnosis not present

## 2018-09-21 DIAGNOSIS — D320 Benign neoplasm of cerebral meninges: Secondary | ICD-10-CM | POA: Diagnosis not present

## 2018-09-21 DIAGNOSIS — Z0000 Encounter for general adult medical examination without abnormal findings: Secondary | ICD-10-CM | POA: Diagnosis not present

## 2018-09-21 DIAGNOSIS — I10 Essential (primary) hypertension: Secondary | ICD-10-CM | POA: Diagnosis not present

## 2018-09-21 DIAGNOSIS — E1165 Type 2 diabetes mellitus with hyperglycemia: Secondary | ICD-10-CM | POA: Diagnosis not present

## 2018-09-21 DIAGNOSIS — E782 Mixed hyperlipidemia: Secondary | ICD-10-CM | POA: Diagnosis not present

## 2018-11-06 DIAGNOSIS — H401132 Primary open-angle glaucoma, bilateral, moderate stage: Secondary | ICD-10-CM | POA: Diagnosis not present

## 2018-11-06 DIAGNOSIS — H2513 Age-related nuclear cataract, bilateral: Secondary | ICD-10-CM | POA: Diagnosis not present

## 2018-11-06 DIAGNOSIS — H02839 Dermatochalasis of unspecified eye, unspecified eyelid: Secondary | ICD-10-CM | POA: Diagnosis not present

## 2018-11-06 DIAGNOSIS — H527 Unspecified disorder of refraction: Secondary | ICD-10-CM | POA: Diagnosis not present

## 2019-03-14 ENCOUNTER — Other Ambulatory Visit: Payer: Self-pay

## 2019-03-14 DIAGNOSIS — Z20822 Contact with and (suspected) exposure to covid-19: Secondary | ICD-10-CM

## 2019-03-15 ENCOUNTER — Encounter (HOSPITAL_COMMUNITY): Payer: Self-pay | Admitting: Obstetrics and Gynecology

## 2019-03-15 ENCOUNTER — Emergency Department (HOSPITAL_COMMUNITY): Payer: Medicare Other

## 2019-03-15 ENCOUNTER — Emergency Department (HOSPITAL_COMMUNITY)
Admission: EM | Admit: 2019-03-15 | Discharge: 2019-03-15 | Disposition: A | Discharge status: Home / Self Care | Payer: Medicare Other | Attending: Emergency Medicine | Admitting: Emergency Medicine

## 2019-03-15 DIAGNOSIS — Z7984 Long term (current) use of oral hypoglycemic drugs: Secondary | ICD-10-CM | POA: Diagnosis not present

## 2019-03-15 DIAGNOSIS — R112 Nausea with vomiting, unspecified: Secondary | ICD-10-CM | POA: Diagnosis not present

## 2019-03-15 DIAGNOSIS — E782 Mixed hyperlipidemia: Secondary | ICD-10-CM | POA: Diagnosis not present

## 2019-03-15 DIAGNOSIS — J069 Acute upper respiratory infection, unspecified: Secondary | ICD-10-CM | POA: Diagnosis not present

## 2019-03-15 DIAGNOSIS — E119 Type 2 diabetes mellitus without complications: Secondary | ICD-10-CM | POA: Insufficient documentation

## 2019-03-15 DIAGNOSIS — Z20828 Contact with and (suspected) exposure to other viral communicable diseases: Secondary | ICD-10-CM | POA: Insufficient documentation

## 2019-03-15 DIAGNOSIS — I10 Essential (primary) hypertension: Secondary | ICD-10-CM | POA: Insufficient documentation

## 2019-03-15 DIAGNOSIS — R062 Wheezing: Secondary | ICD-10-CM | POA: Diagnosis not present

## 2019-03-15 DIAGNOSIS — Z79899 Other long term (current) drug therapy: Secondary | ICD-10-CM | POA: Insufficient documentation

## 2019-03-15 DIAGNOSIS — R0602 Shortness of breath: Secondary | ICD-10-CM | POA: Diagnosis not present

## 2019-03-15 DIAGNOSIS — B9789 Other viral agents as the cause of diseases classified elsewhere: Secondary | ICD-10-CM | POA: Diagnosis not present

## 2019-03-15 DIAGNOSIS — R05 Cough: Secondary | ICD-10-CM | POA: Diagnosis not present

## 2019-03-15 LAB — CBC WITH DIFFERENTIAL/PLATELET
Abs Immature Granulocytes: 0.04 10*3/uL (ref 0.00–0.07)
Basophils Absolute: 0.1 10*3/uL (ref 0.0–0.1)
Basophils Relative: 0 %
Eosinophils Absolute: 0.7 10*3/uL — ABNORMAL HIGH (ref 0.0–0.5)
Eosinophils Relative: 5 %
HCT: 41.7 % (ref 36.0–46.0)
Hemoglobin: 14 g/dL (ref 12.0–15.0)
Immature Granulocytes: 0 %
Lymphocytes Relative: 16 %
Lymphs Abs: 2.2 10*3/uL (ref 0.7–4.0)
MCH: 32 pg (ref 26.0–34.0)
MCHC: 33.6 g/dL (ref 30.0–36.0)
MCV: 95.4 fL (ref 80.0–100.0)
Monocytes Absolute: 0.7 10*3/uL (ref 0.1–1.0)
Monocytes Relative: 5 %
Neutro Abs: 10.3 10*3/uL — ABNORMAL HIGH (ref 1.7–7.7)
Neutrophils Relative %: 74 %
Platelets: 233 10*3/uL (ref 150–400)
RBC: 4.37 MIL/uL (ref 3.87–5.11)
RDW: 12.5 % (ref 11.5–15.5)
WBC: 13.9 10*3/uL — ABNORMAL HIGH (ref 4.0–10.5)
nRBC: 0 % (ref 0.0–0.2)

## 2019-03-15 LAB — NOVEL CORONAVIRUS, NAA: SARS-CoV-2, NAA: NOT DETECTED

## 2019-03-15 LAB — URINALYSIS, ROUTINE W REFLEX MICROSCOPIC
Bilirubin Urine: NEGATIVE
Glucose, UA: NEGATIVE mg/dL
Ketones, ur: 5 mg/dL — AB
Leukocytes,Ua: NEGATIVE
Nitrite: NEGATIVE
Protein, ur: NEGATIVE mg/dL
Specific Gravity, Urine: 1.014 (ref 1.005–1.030)
pH: 5 (ref 5.0–8.0)

## 2019-03-15 LAB — BASIC METABOLIC PANEL
Anion gap: 12 (ref 5–15)
BUN: 9 mg/dL (ref 6–20)
CO2: 21 mmol/L — ABNORMAL LOW (ref 22–32)
Calcium: 8.8 mg/dL — ABNORMAL LOW (ref 8.9–10.3)
Chloride: 105 mmol/L (ref 98–111)
Creatinine, Ser: 0.6 mg/dL (ref 0.44–1.00)
GFR calc Af Amer: >60 mL/min (ref >60)
GFR calc non Af Amer: >60 mL/min (ref >60)
Glucose, Bld: 121 mg/dL — ABNORMAL HIGH (ref 70–99)
Potassium: 3.4 mmol/L — ABNORMAL LOW (ref 3.5–5.1)
Sodium: 138 mmol/L (ref 135–145)

## 2019-03-15 LAB — LACTIC ACID, PLASMA
Lactic Acid, Venous: 2.4 mmol/L (ref 0.5–1.9)
Lactic Acid, Venous: 1.8 mmol/L (ref 0.5–1.9)

## 2019-03-15 LAB — SARS CORONAVIRUS 2 BY RT PCR (HOSPITAL ORDER, PERFORMED IN ~~LOC~~ HOSPITAL LAB): SARS Coronavirus 2: NEGATIVE

## 2019-03-15 LAB — D-DIMER, QUANTITATIVE: D-Dimer, Quant: 0.37 ug/mL-FEU (ref 0.00–0.50)

## 2019-03-15 MED ORDER — ALBUTEROL SULFATE HFA 108 (90 BASE) MCG/ACT IN AERS
6.0000 | INHALATION_SPRAY | Freq: Once | RESPIRATORY_TRACT | Status: AC
  Administered 2019-03-15: 6 via RESPIRATORY_TRACT
  Filled 2019-03-15: qty 6.7

## 2019-03-15 MED ORDER — HYDROCODONE-HOMATROPINE 5-1.5 MG/5ML PO SYRP: 5.0000 mL | ORAL_SOLUTION | Freq: Four times a day (QID) | ORAL | 0 refills | Status: DC | PRN

## 2019-03-15 MED ORDER — FLUTICASONE PROPIONATE 50 MCG/ACT NA SUSP: 2.0000 | Freq: Every day | NASAL | 0 refills | Status: DC

## 2019-03-15 MED ORDER — DEXAMETHASONE SODIUM PHOSPHATE 10 MG/ML IJ SOLN
10.0000 mg | Freq: Once | INTRAMUSCULAR | Status: AC
  Administered 2019-03-15: 10 mg via INTRAVENOUS
  Filled 2019-03-15: qty 1

## 2019-03-15 MED ORDER — SODIUM CHLORIDE 0.9 % IV BOLUS
1000.0000 mL | Freq: Once | INTRAVENOUS | Status: AC
  Administered 2019-03-15: 1000 mL via INTRAVENOUS

## 2019-03-15 NOTE — ED Triage Notes (Signed)
Patient reports to the ED for COVID symptoms, N/V/D, SOB, cough, congestion, muscle aches. Patient reports she wants to be evaluated here.

## 2019-03-15 NOTE — ED Provider Notes (Signed)
[facility] DEPT Provider Note   CSN: [id number] Arrival date & time: 03/15/19  1644    History   Chief Complaint Chief Complaint  Patient presents with   COVID Symptoms   HPI Diamond Brown is a 48 y.o. female with past medical history significant for diabetes, hypertension, PTSD who presents for evaluation of multiple complaints.  Patient with chills however has not taken her temperature.  Midst of body aches and pains, nausea, nonbloody, nonbilious emesis, nonbloody diarrhea as well as productive cough, congestion, rhinorrhea.  Cough nonproductive.  No history of PE or DVT.  Admits to shortness of breath.  She has been taking OTC cough medicine and decongestant without relief of her symptoms.  No known code exposures however she does work at a [employer] and has exposure to multiple people.  She had COVID test negative however this has not resulted.  She is tolerating p.o. intake at home however has decreased appetite for solid food intake.  History obtained from patient and past medical records.  No interpreter is used.    HPI  Past Medical History:  Diagnosis Date   Agoraphobia    Anxiety    Brain tumor (benign) ([place])    Diabetes mellitus without complication (HCC)    High cholesterol    Hypertension    Panic disorder    PTSD (post-traumatic stress disorder)    Restless leg syndrome     There are no active problems to display for this patient.   History reviewed. No pertinent surgical history.   OB History    Gravida      Para      Term      Preterm      AB      Living  2     SAB      TAB      Ectopic      Multiple      Live Births               Home Medications    Prior to Admission medications   Medication Sig Start Date End Date Taking? Authorizing Provider  atenolol (TENORMIN) 50 MG tablet Take 50 mg by mouth daily.    [provider]  benzonatate (TESSALON) 100 MG capsule Take  1-2 capsules (100-200 mg total) by mouth 3 (three) times daily as needed for cough. 10/05/14   Tereasa Coop, PA-C  bimatoprost (LUMIGAN) 0.03 % ophthalmic solution Place 1 drop into both eyes at bedtime.    [provider]  brimonidine-timolol (COMBIGAN) 0.2-0.5 % ophthalmic solution Place 1 drop into both eyes every 12 (twelve) hours.    [provider]  carbamazepine (TEGRETOL) 200 MG tablet Take 200 mg by mouth at bedtime.    [provider]  citalopram (CELEXA) 40 MG tablet Take 40 mg by mouth daily.    [provider]  fluticasone (FLONASE) 50 MCG/ACT nasal spray Place 2 sprays into both nostrils daily. 03/15/19   Deanthony Maull A, PA-C  haloperidol (HALDOL) 1 MG tablet Take 1 mg by mouth daily.    [provider]  HYDROcodone-homatropine (HYCODAN) 5-1.5 MG/5ML syrup Take 5 mLs by mouth every 6 (six) hours as needed for cough. 03/15/19   Zya Finkle A, PA-C  ibuprofen (ADVIL,MOTRIN) 600 MG tablet Take 1 tablet (600 mg total) by mouth every 8 (eight) hours as needed for fever, headache or moderate pain. 10/05/14   Tereasa Coop, PA-C  levocetirizine Harlow Ohms)  5 MG tablet Take 1 tablet (5 mg total) by mouth every evening. 10/05/14   Tereasa Coop, PA-C  metFORMIN (GLUCOPHAGE) 1000 MG tablet Take 1,000 mg by mouth 2 (two) times daily with a meal.    [provider]    Family History Family History  Problem Relation Age of Onset   Cancer Mother    Hypertension Mother    Diabetes Mother    Glaucoma Father    Heart failure Father     Social History Social History   Tobacco Use   Smoking status: Never Smoker   Smokeless tobacco: Never Used  Substance Use Topics   Alcohol use: Yes    Comment: Social   Drug use: No     Allergies   Patient has no known allergies.   Review of Systems Review of Systems  Constitutional: Positive for appetite change, chills and fever (Subjective fever). Negative for activity  change, diaphoresis and fatigue.  HENT: Positive for congestion, postnasal drip and rhinorrhea. Negative for sinus pressure, sinus pain, sneezing, sore throat, tinnitus, trouble swallowing and voice change.   Eyes: Negative.   Respiratory: Positive for cough, shortness of breath and wheezing. Negative for apnea, choking, chest tightness and stridor.   Cardiovascular: Negative.   Gastrointestinal: Positive for abdominal pain, diarrhea, nausea and vomiting. Negative for abdominal distention, anal bleeding, blood in stool, constipation and rectal pain.  Genitourinary: Negative.   Musculoskeletal: Negative.   Skin: Negative.   Neurological: Positive for weakness (Generalized weakness).  All other systems reviewed and are negative.    Physical Exam Updated Vital Signs BP 136/85 (BP Location: Left Arm)    Pulse 98    Temp 99.9 F (37.7 C) (Rectal)    Resp 19    Ht 5\' 5"  (1.651 m)    Wt 72.6 kg    LMP 03/01/2019    SpO2 93%    BMI 26.63 kg/m   Physical Exam Vitals signs and nursing note reviewed.  Constitutional:      General: She is not in acute distress.    Appearance: She is well-developed. She is not ill-appearing, toxic-appearing or diaphoretic.  HENT:     Head: Normocephalic and atraumatic.     Jaw: There is normal jaw occlusion.     Right Ear: Tympanic membrane, ear canal and external ear normal. There is no impacted cerumen. No hemotympanum. Tympanic membrane is not injected, scarred, perforated, erythematous, retracted or bulging.     Left Ear: Tympanic membrane, ear canal and external ear normal. There is no impacted cerumen. No hemotympanum. Tympanic membrane is not injected, scarred, perforated, erythematous, retracted or bulging.     Ears:     Comments: No Mastoid tenderness.    Nose: Nose normal.     Comments: Clear rhinorrhea and congestion to bilateral nares.  No sinus tenderness.    Mouth/Throat:     Mouth: Mucous membranes are moist.     Pharynx: Oropharynx is clear.      Comments: Posterior oropharynx clear.  Mucous membranes moist.  Tonsils without erythema or exudate.  Uvula midline without deviation.  No evidence of PTA or RPA.  No drooling, dysphasia or trismus.  Phonation normal. Eyes:     Pupils: Pupils are equal, round, and reactive to light.  Neck:     Musculoskeletal: Normal range of motion.     Trachea: Trachea and phonation normal.     Meningeal: Brudzinski's sign and Kernig's sign absent.     Comments: No Neck stiffness  or neck rigidity.  No meningismus.  No cervical lymphadenopathy. Cardiovascular:     Rate and Rhythm: Normal rate.     Comments: No murmurs rubs or gallops. Pulmonary:     Effort: No respiratory distress.     Breath sounds: Wheezing present.     Comments: Diffuse wheezing throughout.  No accessory muscle usage.  Able speak in shortened sentences. Abdominal:     General: There is no distension.     Comments: Soft, nontender without rebound or guarding.  No CVA tenderness.  No focal tenderness.  No overlying skin changes.  Musculoskeletal: Normal range of motion.     Comments: Moves all 4 extremities without difficulty.  Lower extremities without edema, erythema or warmth.  Skin:    General: Skin is warm and dry.     Comments: Brisk capillary refill.  No rashes or lesions.  Neurological:     Mental Status: She is alert.     Comments: Ambulatory in department without difficulty.  Cranial nerves II through XII grossly intact.  No facial droop.  No aphasia.  Negative finger-to-nose, heel-to-shin.    ED Treatments / Results  Labs (all labs ordered are listed, but only abnormal results are displayed) Labs Reviewed  CBC WITH DIFFERENTIAL/PLATELET - Abnormal; Notable for the following components:      Result Value   WBC 13.9 (*)    Neutro Abs 10.3 (*)    Eosinophils Absolute 0.7 (*)    All other components within normal limits  BASIC METABOLIC PANEL - Abnormal; Notable for the following components:   Potassium 3.4 (*)     CO2 21 (*)    Glucose, Bld 121 (*)    Calcium 8.8 (*)    All other components within normal limits  LACTIC ACID, PLASMA - Abnormal; Notable for the following components:   Lactic Acid, Venous 2.4 (*)    All other components within normal limits  URINALYSIS, ROUTINE W REFLEX MICROSCOPIC - Abnormal; Notable for the following components:   Color, Urine STRAW (*)    Hgb urine dipstick SMALL (*)    Ketones, ur 5 (*)    Bacteria, UA RARE (*)    All other components within normal limits  CULTURE, BLOOD (ROUTINE X 2)  SARS CORONAVIRUS 2 BY RT PCR (HOSPITAL ORDER, [facility])  CULTURE, BLOOD (ROUTINE X 2)  D-DIMER, QUANTITATIVE (NOT AT [facility])  LACTIC ACID, PLASMA  I-STAT BETA HCG BLOOD, ED ([facility], [facility], [facility] ONLY)    EKG EKG Interpretation  Date/Time:  Thursday March 15 2019 17:54:17 EDT Ventricular Rate:  92 PR Interval:    QRS Duration: 83 QT Interval:  377 QTC Calculation: 467 R Axis:   54 Text Interpretation:  Sinus rhythm Borderline T wave abnormalities Confirmed by Milton Ferguson [phone]) on 03/15/2019 6:25:46 PM   Radiology Dg Chest Portable 1 View  Result Date: 03/15/2019 CLINICAL DATA:  48 year old female with cough EXAM: PORTABLE CHEST 1 VIEW COMPARISON:  Chest radiograph dated 12/13/2013 FINDINGS: The heart size and mediastinal contours are within normal limits. Both lungs are clear. The visualized skeletal structures are unremarkable. IMPRESSION: No active disease. Electronically Signed   By: Anner Crete M.D.   On: 03/15/2019 18:43    Procedures Procedures (including critical care time)  Medications Ordered in ED Medications  albuterol (VENTOLIN HFA) 108 (90 Base) MCG/ACT inhaler 6 puff (6 puffs Inhalation Given 03/15/19 1823)  sodium chloride 0.9 % bolus 1,000 mL (1,000 mLs Intravenous New Bag/Given 03/15/19 1916)  dexamethasone (DECADRON) injection 10 mg (10 mg Intravenous Given 03/15/19 2152)    Initial Impression / Assessment and Plan  / ED Course  I have reviewed the triage vital signs and the nursing notes.  Pertinent labs & imaging results that were available during my care of the patient were reviewed by me and considered in my medical decision making (see chart for details).  48 year old female appears nonseptic non ill presents for evaluation of multiple complaints.  She is afebrile.  Mild tachycardia to 92.  Has a mild tachypnea to 21 however oxygen saturation 94% on room air.  She has diffuse wheezing throughout.  No evidence of DVT on exam.  Abdomen soft without focal tenderness.  She has had generalized weakness however does not have any deficits on exam.  Cranial nerves II through XII grossly intact.  Negative heel-to-shin, finger-to-nose.  She denies any chest pain, hemoptysis.  Cannot rule out PERC secondary to tachycardia.  Will obtain d-dimer as well as basic labs.  COVID test negative however this is not resulted.  CBC with leukocytosis at [id number] Metabolic panel with hypokalemia 3.4, glucose of 1.1 Initial lactic acid 2.4, repeat 1.8 Covid negative Urinalysis negative for infection EKG without ST/T changes D-dimer negative Chest x-ray negative for infiltrates, cardiomegaly, pulmonary edema, pneumothorax.  Patient has been given fluids as well as albuterol.  Lungs with improvement on exam.  She is ambulatory in room with oxygen saturation greater than 94%.  Tachycardia resolved with fluids.  High suspicion for viral upper respiratory infection given symptoms.  Symptomatic management.  She denies any chest pain.  No evidence of DVT on exam.  I will suspicion for ACS, PE, dissection as cause of her intermittent shortness of breath.  She is tolerating p.o. intake in room without difficulty.  Discussed symptomatic management as well as strict return precautions. No evidence of sepsis or sirs on exam.  The patient has been appropriately medically screened and/or stabilized in the ED. I have low suspicion for any other  emergent medical condition which would require further screening, evaluation or treatment in the ED or require inpatient management.  Patient is hemodynamically stable and in no acute distress.  Patient able to ambulate in department prior to ED.  Evaluation does not show acute pathology that would require ongoing or additional emergent interventions while in the emergency department or further inpatient treatment.  I have discussed the diagnosis with the patient and answered all questions.  Pain is been managed while in the emergency department and patient has no further complaints prior to discharge.  Patient is comfortable with plan discussed in room and is stable for discharge at this time.  I have discussed strict return precautions for returning to the emergency department.  Patient was encouraged to follow-up with PCP/specialist refer to at discharge.      *[place] ANISIA CEJA was evaluated in Emergency Department on 03/15/2019 for the symptoms described in the history of present illness. She was evaluated in the context of the global COVID-19 pandemic, which necessitated consideration that the patient might be at risk for infection with the SARS-CoV-2 virus that causes COVID-19. Institutional protocols and algorithms that pertain to the evaluation of patients at risk for COVID-19 are in a state of rapid change based on information released by regulatory bodies including the CDC and federal and state organizations. These policies and algorithms were followed during the patient's care in the ED. Final Clinical Impressions(s) / ED Diagnoses   Final diagnoses:  Viral URI with  cough  Wheeze    ED Discharge Orders         Ordered    HYDROcodone-homatropine (HYCODAN) 5-1.5 MG/5ML syrup  Every 6 hours PRN     03/15/19 2216    fluticasone (FLONASE) 50 MCG/ACT nasal spray  Daily     03/15/19 2216           Nakenya Theall A, PA-C 03/15/19 2219    Milton Ferguson, MD 03/17/19 1034

## 2019-03-15 NOTE — ED Notes (Addendum)
Date and time results received: 03/15/19 6:31 PM  (use smartphrase ".now" to insert current time)  Test:lactic acid  Critical Value: 2.4  Name of Provider Notified: Fara Chute RN, [place] PA  Orders Received? Or Actions Taken?:

## 2019-03-15 NOTE — Discharge Instructions (Signed)
Take the medications as prescribed.  You develop extreme shortness of breath, chest pain please seek reevaluation.

## 2019-03-17 ENCOUNTER — Telehealth (HOSPITAL_BASED_OUTPATIENT_CLINIC_OR_DEPARTMENT_OTHER): Payer: Self-pay | Admitting: Emergency Medicine

## 2019-03-19 DIAGNOSIS — E782 Mixed hyperlipidemia: Secondary | ICD-10-CM | POA: Diagnosis not present

## 2019-03-19 DIAGNOSIS — J22 Unspecified acute lower respiratory infection: Secondary | ICD-10-CM | POA: Diagnosis not present

## 2019-03-19 DIAGNOSIS — I10 Essential (primary) hypertension: Secondary | ICD-10-CM | POA: Diagnosis not present

## 2019-03-19 DIAGNOSIS — E1165 Type 2 diabetes mellitus with hyperglycemia: Secondary | ICD-10-CM | POA: Diagnosis not present

## 2019-03-20 LAB — CULTURE, BLOOD (ROUTINE X 2)
Culture: NO GROWTH
Special Requests: ADEQUATE

## 2019-03-21 ENCOUNTER — Telehealth: Payer: Self-pay | Admitting: *Deleted

## 2019-03-21 NOTE — Telephone Encounter (Signed)
Post ED Visit - Positive Culture Follow-up  Culture report reviewed by antimicrobial stewardship pharmacist: [facility] Team []  Elenor Quinones, Pharm.D. []  Heide Guile, Pharm.D., BCPS AQ-ID []  Parks Neptune, Pharm.D., BCPS []  Alycia Rossetti, Pharm.D., BCPS []  [place], [place].D., BCPS, AAHIVP []  Legrand Como, Pharm.D., BCPS, AAHIVP []  Salome Arnt, PharmD, BCPS []  Johnnette Gourd, PharmD, BCPS []  Hughes Better, PharmD, BCPS []  Leeroy Cha, PharmD []  Laqueta Linden, PharmD, BCPS []  Albertina Parr, PharmD  [facility] Team [x]  Leodis Sias, PharmD []  Lindell Spar, PharmD []  Royetta Asal, PharmD []  Graylin Shiver, Rph []  Rema Fendt) Glennon Mac, PharmD []  Arlyn Dunning, PharmD []  Netta Cedars, PharmD []  Dia Sitter, PharmD []  Leone Haven, PharmD []  Gretta Arab, PharmD []  Theodis Shove, PharmD []  Peggyann Juba, PharmD []  Reuel Boom, PharmD   Positive blood culture Likely contaminant and no further patient follow-up is required at this time.  [facility] 03/21/2019, 12:33 PM

## 2019-03-28 ENCOUNTER — Other Ambulatory Visit: Payer: Self-pay

## 2019-03-28 NOTE — Patient Outreach (Signed)
[facility]) Care Management  03/28/2019  AURI BUCKLIN 09-05-1970 [id number]   Medication Adherence call to Mrs. [place] Compliant Voice message left with a call back number. Mrs. Martucci is showing past due on Lisinopril 10 mg and Lovastatin 40 mg under [place].   [place] Management Direct Dial [phone]  Fax [phone] [email or web address]

## 2019-04-02 ENCOUNTER — Other Ambulatory Visit: Payer: Self-pay

## 2019-04-02 NOTE — Patient Outreach (Signed)
[facility]) Care Management  04/02/2019  Diamond Brown 10-27-1970 [id number]   Medication Adherence call to Diamond Brown HIPPA Compliant Voice message left with a call back number. Diamond Brown is showing past due on Lovastatin 40 mg and Lisinopril 10 mg under [place].   [place] Management Direct Dial [phone]  Fax [phone] [email or web address]

## 2019-04-09 DIAGNOSIS — E782 Mixed hyperlipidemia: Secondary | ICD-10-CM | POA: Diagnosis not present

## 2019-04-09 DIAGNOSIS — E1165 Type 2 diabetes mellitus with hyperglycemia: Secondary | ICD-10-CM | POA: Diagnosis not present

## 2019-04-09 DIAGNOSIS — I10 Essential (primary) hypertension: Secondary | ICD-10-CM | POA: Diagnosis not present

## 2019-04-16 DIAGNOSIS — I10 Essential (primary) hypertension: Secondary | ICD-10-CM | POA: Diagnosis not present

## 2019-04-16 DIAGNOSIS — Z23 Encounter for immunization: Secondary | ICD-10-CM | POA: Diagnosis not present

## 2019-04-16 DIAGNOSIS — E782 Mixed hyperlipidemia: Secondary | ICD-10-CM | POA: Diagnosis not present

## 2019-04-16 DIAGNOSIS — E1165 Type 2 diabetes mellitus with hyperglycemia: Secondary | ICD-10-CM | POA: Diagnosis not present

## 2019-04-16 DIAGNOSIS — D320 Benign neoplasm of cerebral meninges: Secondary | ICD-10-CM | POA: Diagnosis not present

## 2019-04-25 ENCOUNTER — Other Ambulatory Visit: Payer: Self-pay

## 2019-04-25 DIAGNOSIS — Z20822 Contact with and (suspected) exposure to covid-19: Secondary | ICD-10-CM

## 2019-04-26 LAB — NOVEL CORONAVIRUS, NAA: SARS-CoV-2, NAA: NOT DETECTED

## 2019-04-30 DIAGNOSIS — S29012A Strain of muscle and tendon of back wall of thorax, initial encounter: Secondary | ICD-10-CM | POA: Diagnosis not present

## 2019-04-30 DIAGNOSIS — S338XXA Sprain of other parts of lumbar spine and pelvis, initial encounter: Secondary | ICD-10-CM | POA: Diagnosis not present

## 2019-04-30 DIAGNOSIS — S8002XA Contusion of left knee, initial encounter: Secondary | ICD-10-CM | POA: Diagnosis not present

## 2019-04-30 DIAGNOSIS — S161XXA Strain of muscle, fascia and tendon at neck level, initial encounter: Secondary | ICD-10-CM | POA: Diagnosis not present

## 2019-06-18 DIAGNOSIS — M545 Low back pain: Secondary | ICD-10-CM | POA: Diagnosis not present

## 2019-06-18 DIAGNOSIS — M533 Sacrococcygeal disorders, not elsewhere classified: Secondary | ICD-10-CM | POA: Diagnosis not present

## 2019-06-25 DIAGNOSIS — Z124 Encounter for screening for malignant neoplasm of cervix: Secondary | ICD-10-CM | POA: Diagnosis not present

## 2019-06-25 DIAGNOSIS — Z1231 Encounter for screening mammogram for malignant neoplasm of breast: Secondary | ICD-10-CM | POA: Diagnosis not present

## 2019-06-27 DIAGNOSIS — M5416 Radiculopathy, lumbar region: Secondary | ICD-10-CM | POA: Diagnosis not present

## 2019-07-06 DIAGNOSIS — M5416 Radiculopathy, lumbar region: Secondary | ICD-10-CM | POA: Diagnosis not present

## 2019-07-25 DIAGNOSIS — M5416 Radiculopathy, lumbar region: Secondary | ICD-10-CM | POA: Diagnosis not present

## 2019-10-02 DIAGNOSIS — I10 Essential (primary) hypertension: Secondary | ICD-10-CM | POA: Diagnosis not present

## 2019-10-02 DIAGNOSIS — E1165 Type 2 diabetes mellitus with hyperglycemia: Secondary | ICD-10-CM | POA: Diagnosis not present

## 2019-10-08 DIAGNOSIS — I10 Essential (primary) hypertension: Secondary | ICD-10-CM | POA: Diagnosis not present

## 2019-10-08 DIAGNOSIS — E782 Mixed hyperlipidemia: Secondary | ICD-10-CM | POA: Diagnosis not present

## 2019-10-08 DIAGNOSIS — E1165 Type 2 diabetes mellitus with hyperglycemia: Secondary | ICD-10-CM | POA: Diagnosis not present

## 2019-10-08 DIAGNOSIS — Z0000 Encounter for general adult medical examination without abnormal findings: Secondary | ICD-10-CM | POA: Diagnosis not present

## 2019-11-08 ENCOUNTER — Other Ambulatory Visit: Payer: Self-pay

## 2019-11-08 ENCOUNTER — Emergency Department (HOSPITAL_COMMUNITY)
Admission: EM | Admit: 2019-11-08 | Discharge: 2019-11-08 | Disposition: A | Discharge status: Home / Self Care | Payer: Medicare Other | Attending: Emergency Medicine | Admitting: Emergency Medicine

## 2019-11-08 DIAGNOSIS — E119 Type 2 diabetes mellitus without complications: Secondary | ICD-10-CM | POA: Diagnosis not present

## 2019-11-08 DIAGNOSIS — I10 Essential (primary) hypertension: Secondary | ICD-10-CM | POA: Insufficient documentation

## 2019-11-08 DIAGNOSIS — R112 Nausea with vomiting, unspecified: Secondary | ICD-10-CM | POA: Diagnosis not present

## 2019-11-08 DIAGNOSIS — R1013 Epigastric pain: Secondary | ICD-10-CM | POA: Diagnosis not present

## 2019-11-08 DIAGNOSIS — Z7984 Long term (current) use of oral hypoglycemic drugs: Secondary | ICD-10-CM | POA: Insufficient documentation

## 2019-11-08 DIAGNOSIS — R1110 Vomiting, unspecified: Secondary | ICD-10-CM | POA: Diagnosis present

## 2019-11-08 DIAGNOSIS — R110 Nausea: Secondary | ICD-10-CM | POA: Insufficient documentation

## 2019-11-08 DIAGNOSIS — Z79899 Other long term (current) drug therapy: Secondary | ICD-10-CM | POA: Diagnosis not present

## 2019-11-08 LAB — URINALYSIS, ROUTINE W REFLEX MICROSCOPIC
Bilirubin Urine: NEGATIVE
Glucose, UA: NEGATIVE mg/dL
Hgb urine dipstick: NEGATIVE
Ketones, ur: NEGATIVE mg/dL
Leukocytes,Ua: NEGATIVE
Nitrite: NEGATIVE
Protein, ur: 30 mg/dL — AB
Specific Gravity, Urine: 1.023 (ref 1.005–1.030)
pH: 5 (ref 5.0–8.0)

## 2019-11-08 LAB — COMPREHENSIVE METABOLIC PANEL
ALT: 38 U/L (ref 0–44)
AST: 27 U/L (ref 15–41)
Albumin: 3.4 g/dL — ABNORMAL LOW (ref 3.5–5.0)
Alkaline Phosphatase: 58 U/L (ref 38–126)
Anion gap: 9 (ref 5–15)
BUN: 7 mg/dL (ref 6–20)
CO2: 25 mmol/L (ref 22–32)
Calcium: 8.5 mg/dL — ABNORMAL LOW (ref 8.9–10.3)
Chloride: 104 mmol/L (ref 98–111)
Creatinine, Ser: 0.7 mg/dL (ref 0.44–1.00)
GFR calc Af Amer: >60 mL/min (ref >60)
GFR calc non Af Amer: >60 mL/min (ref >60)
Glucose, Bld: 148 mg/dL — ABNORMAL HIGH (ref 70–99)
Potassium: 3.8 mmol/L (ref 3.5–5.1)
Sodium: 138 mmol/L (ref 135–145)
Total Bilirubin: 0.8 mg/dL (ref 0.3–1.2)
Total Protein: 6.1 g/dL — ABNORMAL LOW (ref 6.5–8.1)

## 2019-11-08 LAB — I-STAT BETA HCG BLOOD, ED (MC, WL, AP ONLY): I-stat hCG, quantitative: <5 m[IU]/mL (ref <5)

## 2019-11-08 LAB — CBC
HCT: 40.1 % (ref 36.0–46.0)
Hemoglobin: 13.3 g/dL (ref 12.0–15.0)
MCH: 31.6 pg (ref 26.0–34.0)
MCHC: 33.2 g/dL (ref 30.0–36.0)
MCV: 95.2 fL (ref 80.0–100.0)
Platelets: 220 10*3/uL (ref 150–400)
RBC: 4.21 MIL/uL (ref 3.87–5.11)
RDW: 12.8 % (ref 11.5–15.5)
WBC: 10.2 10*3/uL (ref 4.0–10.5)
nRBC: 0 % (ref 0.0–0.2)

## 2019-11-08 LAB — LIPASE, BLOOD: Lipase: 25 U/L (ref 11–51)

## 2019-11-08 LAB — POC OCCULT BLOOD, ED: Fecal Occult Bld: NEGATIVE

## 2019-11-08 MED ORDER — FAMOTIDINE 20 MG PO TABS
20.0000 mg | ORAL_TABLET | Freq: Every day | ORAL | 0 refills | Status: DC
Start: 2019-11-08 — End: 2020-10-11

## 2019-11-08 MED ORDER — FAMOTIDINE 20 MG PO TABS
20.0000 mg | ORAL_TABLET | Freq: Every day | ORAL | 0 refills | Status: DC
Start: 2019-11-08 — End: 2023-03-31

## 2019-11-08 MED ORDER — PANTOPRAZOLE SODIUM 20 MG PO TBEC: 20.0000 mg | DELAYED_RELEASE_TABLET | Freq: Every day | ORAL | 0 refills | Status: DC

## 2019-11-08 MED ORDER — SODIUM CHLORIDE 0.9 % IV BOLUS
1000.0000 mL | Freq: Once | INTRAVENOUS | Status: AC
  Administered 2019-11-08: 1000 mL via INTRAVENOUS

## 2019-11-08 MED ORDER — ONDANSETRON HCL 4 MG/2ML IJ SOLN
4.0000 mg | Freq: Once | INTRAMUSCULAR | Status: AC
  Administered 2019-11-08: 4 mg via INTRAVENOUS
  Filled 2019-11-08: qty 2

## 2019-11-08 MED ORDER — SODIUM CHLORIDE 0.9% FLUSH: 3.0000 mL | Freq: Once | INTRAVENOUS | Status: DC

## 2019-11-08 NOTE — ED Provider Notes (Signed)
[facility] EMERGENCY DEPARTMENT Provider Note   CSN: [id number] Arrival date & time: 11/08/19  1036     History Chief Complaint  Patient presents with  . Emesis    Diamond Brown is a 49 y.o. female with PMHx diabetes who presents to the ED today with complaint of ongoing intermittent epigastric abdominal pain x 2 years with intermittent emesis for which she typically takes peptobismal with relief. Pt went to her PCP this morning regarding worsening symptoms over the last 2-3 weeks. She also mentions vomiting "pinkish" emesis 4 days ago, none since. She states her PCP told her to come to the ED for further eval however after some time mentions that she had an issue with her insurance and therefore could not be referred to GI from PCP for whatever reason. Pt denies fevers, chills, chest pain, SOB, diarrhea, constipation, or any other associated symptoms. No heavy alcohol use or cigarettes currently - states she used to smoke a "puff" here and there. No heavy NSAID use.   The history is provided by the patient and medical records.       Past Medical History:  Diagnosis Date  . Agoraphobia   . Anxiety   . Brain tumor (benign) ([place])   . Diabetes mellitus without complication ([place])   . High cholesterol   . Hypertension   . Panic disorder   . PTSD (post-traumatic stress disorder)   . Restless leg syndrome     There are no problems to display for this patient.   No past surgical history on file.   OB History    Gravida      Para      Term      Preterm      AB      Living  2     SAB      TAB      Ectopic      Multiple      Live Births              Family History  Problem Relation Age of Onset  . Cancer Mother   . Hypertension Mother   . Diabetes Mother   . Glaucoma Father   . Heart failure Father     Social History   Tobacco Use  . Smoking status: Never Smoker  . Smokeless tobacco: Never Used  Substance Use Topics  .  Alcohol use: Yes    Comment: Social  . Drug use: No    Home Medications Prior to Admission medications   Medication Sig Start Date End Date Taking? Authorizing Provider  atenolol (TENORMIN) 50 MG tablet Take 50 mg by mouth daily.    [provider]  benzonatate (TESSALON) 100 MG capsule Take 1-2 capsules (100-200 mg total) by mouth 3 (three) times daily as needed for cough. 10/05/14   Tereasa Coop, PA-C  bimatoprost (LUMIGAN) 0.03 % ophthalmic solution Place 1 drop into both eyes at bedtime.    [provider]  brimonidine-timolol (COMBIGAN) 0.2-0.5 % ophthalmic solution Place 1 drop into both eyes every 12 (twelve) hours.    [provider]  carbamazepine (TEGRETOL) 200 MG tablet Take 200 mg by mouth at bedtime.    [provider]  citalopram (CELEXA) 40 MG tablet Take 40 mg by mouth daily.    [provider]  famotidine (PEPCID) 20 MG tablet Take 1 tablet (20 mg total) by mouth daily. 11/08/19 12/08/19  Eustaquio Maize, PA-C  fluticasone (  FLONASE) 50 MCG/ACT nasal spray Place 2 sprays into both nostrils daily. 03/15/19   Henderly, Britni A, PA-C  haloperidol (HALDOL) 1 MG tablet Take 1 mg by mouth daily.    [provider]  HYDROcodone-homatropine (HYCODAN) 5-1.5 MG/5ML syrup Take 5 mLs by mouth every 6 (six) hours as needed for cough. 03/15/19   Henderly, Britni A, PA-C  ibuprofen (ADVIL,MOTRIN) 600 MG tablet Take 1 tablet (600 mg total) by mouth every 8 (eight) hours as needed for fever, headache or moderate pain. 10/05/14   Tereasa Coop, PA-C  levocetirizine (XYZAL) 5 MG tablet Take 1 tablet (5 mg total) by mouth every evening. 10/05/14   Tereasa Coop, PA-C  metFORMIN (GLUCOPHAGE) 1000 MG tablet Take 1,000 mg by mouth 2 (two) times daily with a meal.    [provider]  pantoprazole (PROTONIX) 20 MG tablet Take 1 tablet (20 mg total) by mouth daily. 11/08/19 12/08/19  Eustaquio Maize, PA-C    Allergies    Patient has no  known allergies.  Review of Systems   Review of Systems  Constitutional: Negative for chills and fever.  Respiratory: Negative for shortness of breath.   Cardiovascular: Negative for chest pain.  Gastrointestinal: Positive for abdominal pain, nausea and vomiting. Negative for constipation and diarrhea.  All other systems reviewed and are negative.   Physical Exam Updated Vital Signs BP 116/82 (BP Location: Left Arm)   Pulse 83   Temp 98.8 F (37.1 C) (Oral)   Resp 16   Ht 5\' 5"  (1.651 m)   Wt 72 kg   LMP 10/29/2019   SpO2 98%   BMI 26.41 kg/m   Physical Exam Vitals and nursing note reviewed.  Constitutional:      Appearance: She is not ill-appearing or diaphoretic.  HENT:     Head: Normocephalic and atraumatic.  Eyes:     Conjunctiva/sclera: Conjunctivae normal.  Cardiovascular:     Rate and Rhythm: Normal rate and regular rhythm.     Pulses: Normal pulses.  Pulmonary:     Effort: Pulmonary effort is normal.     Breath sounds: Normal breath sounds. No wheezing, rhonchi or rales.  Abdominal:     Palpations: Abdomen is soft.     Tenderness: There is no abdominal tenderness. There is no right CVA tenderness, left CVA tenderness, guarding or rebound.  Genitourinary:    Comments: Chaperone present for GU exam. No external hemorrhoid appreciated. Light brown stool on gloved finger. Guaiac negative.  Musculoskeletal:     Cervical back: Neck supple.  Skin:    General: Skin is warm and dry.  Neurological:     Mental Status: She is alert.     ED Results / Procedures / Treatments   Labs (all labs ordered are listed, but only abnormal results are displayed) Labs Reviewed  COMPREHENSIVE METABOLIC PANEL - Abnormal; Notable for the following components:      Result Value   Glucose, Bld 148 (*)    Calcium 8.5 (*)    Total Protein 6.1 (*)    Albumin 3.4 (*)    All other components within normal limits  URINALYSIS, ROUTINE W REFLEX MICROSCOPIC - Abnormal; Notable for the  following components:   APPearance HAZY (*)    Protein, ur 30 (*)    Bacteria, UA RARE (*)    All other components within normal limits  LIPASE, BLOOD  CBC  I-STAT BETA HCG BLOOD, ED ([facility], [facility], [facility] ONLY)  POC OCCULT BLOOD, ED  EKG None  Radiology No results found.  Procedures Procedures (including critical care time)  Medications Ordered in ED Medications  sodium chloride flush (NS) 0.9 % injection 3 mL (3 mLs Intravenous Not Given 11/08/19 1250)  sodium chloride 0.9 % bolus 1,000 mL (1,000 mLs Intravenous New Bag/Given 11/08/19 1407)  ondansetron (ZOFRAN) injection 4 mg (4 mg Intravenous Given 11/08/19 1406)    ED Course  I have reviewed the triage vital signs and the nursing notes.  Pertinent labs & imaging results that were available during my care of the patient were reviewed by me and considered in my medical decision making (see chart for details).  Clinical Course as of Nov 07 1408  Thu Nov 08, 2019  1316 Fecal Occult Blood, POC: NEGATIVE [MV]    Clinical Course User Index [MV] Eustaquio Maize, PA-C   MDM Rules/Calculators/A&P                          49 year old female who presents the ED from PCPs office for ongoing epigastric pain for the past 2 years with intermittent nausea and vomiting which is typically treated with Pepto.  It appears that patient was sent to the ED from PCP due to insurance issues and inability for PCP to refer directly to GI.  Patient does mention she vomited pinkish emesis 4 days ago however none since.  She does chronically take Pepto and I suspect this is the cause however will check GU exam to rule out concern for GI bleed.  On arrival to the ED patient is afebrile, nontachycardic and nontachypneic.  She appears to be in no acute distress.  She has no focal abdominal tenderness on exam.  Do not feel she needs imaging at this time.  No risk factors including heavy alcohol use, cigarettes, heavy NSAIDs to suggest peptic ulcer disease.  Suspect  some form of GERD versus gastritis.   Lab work was obtained prior to being seen.  No acute abnormalities today.  Hemoglobin very stable at 13.3, patient hemodynamically stable.  Her guaiac test returned negative.  Remainder of lab work unremarkable.  Had initially ordered fluids and IV Zofran given patient complaint of nausea however she states she would rather have referral to GI and go home.  Feel this is reasonable today.  Will discharge home with Pepcid and Protonix and place referral to GI.  Strict return precautions have been discussed with patient including worsening pain, vomiting coffee-ground emesis, melena, lightheadedness, dizziness.  Patient is in agreement with plan is stable for discharge home.  Attending physician Dr. Sabra Heck has evaluated patient and agrees with plan.   This note was prepared using Dragon voice recognition software and may include unintentional dictation errors due to the inherent limitations of voice recognition software.  Final Clinical Impression(s) / ED Diagnoses Final diagnoses:  Chronic nausea  Epigastric pain    Rx / DC Orders ED Discharge Orders         Ordered    famotidine (PEPCID) 20 MG tablet  Daily     Discontinue  Reprint     11/08/19 1406    pantoprazole (PROTONIX) 20 MG tablet  Daily     Discontinue  Reprint     11/08/19 1406          Discharge Instructions     Please pick up medication and take as prescribed  Call Dr. Kathline Magic office to schedule an appointment for further evaluation  Return to the ED for  any worsening symptoms including worsening pain, vomiting what appears to look like ground up coffee beans, dark black colored stools, dizziness/lightheadedness, chest pain, SOB, or any other associated symptoms.        Eustaquio Maize, PA-C 11/08/19 1410    Noemi Chapel, MD 11/09/19 [phone]

## 2019-11-08 NOTE — ED Triage Notes (Signed)
Has been vomiting blood 2 days ago, has hx of constant nausea and vomiting for almost 2 years. Has not told dr until today. Has been losing weight, having loose stools.

## 2019-11-08 NOTE — ED Notes (Signed)
Patient stating she does not know why we are attempting to start an iv and give her medications down here if we are not going to be able to resolve her ongoing issue of n/v. This RN explained that we are trying to relieve her symptoms of n/v while she is here but that we do not have to start an iv on her/give her medications if she does not want [place] to. PA Alroy Bailiff made aware and will come speak with patient regarding her concerns.

## 2019-11-08 NOTE — ED Provider Notes (Signed)
Medical screening examination/treatment/procedure(s) were conducted as a shared visit with non-physician practitioner(s) and myself.  I personally evaluated the patient during the encounter.  Clinical Impression:   Final diagnoses:  Chronic nausea  Epigastric pain    Patient has chronic nausea, presents with some discomfort in the abdomen which is chronic intermittent over the last couple of years.  Her nausea has been rather persistent over time, she takes Pepto-Bismol intermittently.  She went to the doctor's office today, at that point the story becomes somewhat blurry and that she states that she was not supposed to be seen because of insurance, they recommended that she come to the emergency department for some reason.  The patient does report that occasionally when she vomits she has some pink-tinged mucus but no bright red blood, no coffee grounds.  On my exam the patient has an extremely soft abdomen with absolutely no tenderness or guarding or masses.  Her vital signs are totally normal, please see below.  She does not drink alcohol smoke cigarettes or use any marijuana.  Labs reviewed, reassuring, this includes a lipase of 25, metabolic panel without any liver abnormalities and a CBC without leukocytosis.  Urinalysis reveals normal specific gravity without ketones.  Anticipate discharge with antacid therapy, some combination of Pepcid with a proton pump inhibitor, the patient is in agreement to the plan, she has been on the phone with her insurance company trying to work out how she can be seen by family doctor and a gastroenterologist.  Referral will be given  Vitals:   11/08/19 1058  BP: 116/82  Pulse: 83  Resp: 16  Temp: 98.8 F (37.1 C)  SpO2: 98%        Noemi Chapel, MD 11/09/19 0720

## 2019-11-08 NOTE — ED Notes (Signed)
ED Provider at bedside. 

## 2019-11-08 NOTE — Discharge Instructions (Addendum)
Please pick up medication and take as prescribed  Call Dr. Kathline Magic office to schedule an appointment for further evaluation  Return to the ED for any worsening symptoms including worsening pain, vomiting what appears to look like ground up coffee beans, dark black colored stools, dizziness/lightheadedness, chest pain, SOB, or any other associated symptoms.

## 2019-11-08 NOTE — ED Notes (Signed)
Patient verbalized understanding of dc instructions, vss, ambulatory with nad.   

## 2019-11-08 NOTE — ED Notes (Signed)
Pt requesting prescriptions be sent to [facility] on [address], PA Diamond Brown made aware and Rxs resent to correct pharmacy.

## 2019-12-13 DIAGNOSIS — R112 Nausea with vomiting, unspecified: Secondary | ICD-10-CM | POA: Diagnosis not present

## 2019-12-13 DIAGNOSIS — R197 Diarrhea, unspecified: Secondary | ICD-10-CM | POA: Diagnosis not present

## 2020-01-21 DIAGNOSIS — Z1159 Encounter for screening for other viral diseases: Secondary | ICD-10-CM | POA: Diagnosis not present

## 2020-01-24 DIAGNOSIS — K5730 Diverticulosis of large intestine without perforation or abscess without bleeding: Secondary | ICD-10-CM | POA: Diagnosis not present

## 2020-01-24 DIAGNOSIS — K640 First degree hemorrhoids: Secondary | ICD-10-CM | POA: Diagnosis not present

## 2020-01-24 DIAGNOSIS — R112 Nausea with vomiting, unspecified: Secondary | ICD-10-CM | POA: Diagnosis not present

## 2020-01-24 DIAGNOSIS — K2930 Chronic superficial gastritis without bleeding: Secondary | ICD-10-CM | POA: Diagnosis not present

## 2020-01-24 DIAGNOSIS — R197 Diarrhea, unspecified: Secondary | ICD-10-CM | POA: Diagnosis not present

## 2020-01-30 DIAGNOSIS — K2930 Chronic superficial gastritis without bleeding: Secondary | ICD-10-CM | POA: Diagnosis not present

## 2020-02-06 ENCOUNTER — Other Ambulatory Visit (HOSPITAL_COMMUNITY): Payer: Self-pay | Admitting: Gastroenterology

## 2020-02-06 DIAGNOSIS — R112 Nausea with vomiting, unspecified: Secondary | ICD-10-CM

## 2020-02-13 ENCOUNTER — Other Ambulatory Visit: Payer: Self-pay

## 2020-02-13 ENCOUNTER — Encounter (HOSPITAL_COMMUNITY)
Admission: RE | Admit: 2020-02-13 | Discharge: 2020-02-13 | Disposition: A | Discharge status: Home / Self Care | Payer: Medicare Other | Source: Ambulatory Visit | Attending: Gastroenterology | Admitting: Gastroenterology

## 2020-02-13 DIAGNOSIS — R112 Nausea with vomiting, unspecified: Secondary | ICD-10-CM | POA: Diagnosis not present

## 2020-02-13 DIAGNOSIS — R140 Abdominal distension (gaseous): Secondary | ICD-10-CM | POA: Diagnosis not present

## 2020-02-13 DIAGNOSIS — R109 Unspecified abdominal pain: Secondary | ICD-10-CM | POA: Diagnosis not present

## 2020-02-13 MED ORDER — TECHNETIUM TC 99M SULFUR COLLOID
2.0000 | Freq: Once | INTRAVENOUS | Status: AC | PRN
  Administered 2020-02-13: 2 via INTRAVENOUS

## 2020-04-22 DIAGNOSIS — E782 Mixed hyperlipidemia: Secondary | ICD-10-CM | POA: Diagnosis not present

## 2020-04-22 DIAGNOSIS — I10 Essential (primary) hypertension: Secondary | ICD-10-CM | POA: Diagnosis not present

## 2020-04-22 DIAGNOSIS — E1165 Type 2 diabetes mellitus with hyperglycemia: Secondary | ICD-10-CM | POA: Diagnosis not present

## 2020-04-29 DIAGNOSIS — Z23 Encounter for immunization: Secondary | ICD-10-CM | POA: Diagnosis not present

## 2020-04-29 DIAGNOSIS — I10 Essential (primary) hypertension: Secondary | ICD-10-CM | POA: Diagnosis not present

## 2020-04-29 DIAGNOSIS — D320 Benign neoplasm of cerebral meninges: Secondary | ICD-10-CM | POA: Diagnosis not present

## 2020-04-29 DIAGNOSIS — E782 Mixed hyperlipidemia: Secondary | ICD-10-CM | POA: Diagnosis not present

## 2020-04-29 DIAGNOSIS — E1165 Type 2 diabetes mellitus with hyperglycemia: Secondary | ICD-10-CM | POA: Diagnosis not present

## 2020-08-18 DIAGNOSIS — Z1231 Encounter for screening mammogram for malignant neoplasm of breast: Secondary | ICD-10-CM | POA: Diagnosis not present

## 2020-10-10 ENCOUNTER — Encounter (HOSPITAL_COMMUNITY): Payer: Self-pay

## 2020-10-10 ENCOUNTER — Encounter (HOSPITAL_COMMUNITY): Payer: Self-pay | Admitting: Emergency Medicine

## 2020-10-10 ENCOUNTER — Ambulatory Visit (HOSPITAL_COMMUNITY)
Admission: EM | Admit: 2020-10-10 | Discharge: 2020-10-10 | Disposition: A | Discharge status: Home / Self Care | Payer: Medicare Other

## 2020-10-10 ENCOUNTER — Other Ambulatory Visit: Payer: Self-pay

## 2020-10-10 ENCOUNTER — Emergency Department (HOSPITAL_COMMUNITY): Payer: Medicare Other

## 2020-10-10 ENCOUNTER — Emergency Department (HOSPITAL_COMMUNITY)
Admission: EM | Admit: 2020-10-10 | Discharge: 2020-10-11 | Disposition: A | Discharge status: Home / Self Care | Payer: Medicare Other | Attending: Emergency Medicine | Admitting: Emergency Medicine

## 2020-10-10 DIAGNOSIS — Z7984 Long term (current) use of oral hypoglycemic drugs: Secondary | ICD-10-CM | POA: Insufficient documentation

## 2020-10-10 DIAGNOSIS — I10 Essential (primary) hypertension: Secondary | ICD-10-CM | POA: Insufficient documentation

## 2020-10-10 DIAGNOSIS — E119 Type 2 diabetes mellitus without complications: Secondary | ICD-10-CM | POA: Insufficient documentation

## 2020-10-10 DIAGNOSIS — M549 Dorsalgia, unspecified: Secondary | ICD-10-CM | POA: Diagnosis not present

## 2020-10-10 DIAGNOSIS — M5441 Lumbago with sciatica, right side: Secondary | ICD-10-CM | POA: Insufficient documentation

## 2020-10-10 DIAGNOSIS — R3981 Functional urinary incontinence: Secondary | ICD-10-CM | POA: Insufficient documentation

## 2020-10-10 DIAGNOSIS — R32 Unspecified urinary incontinence: Secondary | ICD-10-CM | POA: Diagnosis not present

## 2020-10-10 DIAGNOSIS — R29898 Other symptoms and signs involving the musculoskeletal system: Secondary | ICD-10-CM | POA: Diagnosis not present

## 2020-10-10 DIAGNOSIS — M5442 Lumbago with sciatica, left side: Secondary | ICD-10-CM | POA: Diagnosis not present

## 2020-10-10 DIAGNOSIS — M545 Low back pain, unspecified: Secondary | ICD-10-CM | POA: Diagnosis not present

## 2020-10-10 DIAGNOSIS — Z79899 Other long term (current) drug therapy: Secondary | ICD-10-CM | POA: Insufficient documentation

## 2020-10-10 DIAGNOSIS — Z743 Need for continuous supervision: Secondary | ICD-10-CM | POA: Diagnosis not present

## 2020-10-10 LAB — COMPREHENSIVE METABOLIC PANEL
ALT: 26 U/L (ref 0–44)
AST: 24 U/L (ref 15–41)
Albumin: 4.1 g/dL (ref 3.5–5.0)
Alkaline Phosphatase: 66 U/L (ref 38–126)
Anion gap: 11 (ref 5–15)
BUN: 13 mg/dL (ref 6–20)
CO2: 24 mmol/L (ref 22–32)
Calcium: 9.6 mg/dL (ref 8.9–10.3)
Chloride: 105 mmol/L (ref 98–111)
Creatinine, Ser: 0.83 mg/dL (ref 0.44–1.00)
GFR, Estimated: >60 mL/min (ref >60)
Glucose, Bld: 99 mg/dL (ref 70–99)
Potassium: 4.4 mmol/L (ref 3.5–5.1)
Sodium: 140 mmol/L (ref 135–145)
Total Bilirubin: 0.7 mg/dL (ref 0.3–1.2)
Total Protein: 7 g/dL (ref 6.5–8.1)

## 2020-10-10 LAB — CBC WITH DIFFERENTIAL/PLATELET
Abs Immature Granulocytes: 0.02 10*3/uL (ref 0.00–0.07)
Basophils Absolute: 0.1 10*3/uL (ref 0.0–0.1)
Basophils Relative: 1 %
Eosinophils Absolute: 0.5 10*3/uL (ref 0.0–0.5)
Eosinophils Relative: 5 %
HCT: 42.8 % (ref 36.0–46.0)
Hemoglobin: 14.5 g/dL (ref 12.0–15.0)
Immature Granulocytes: 0 %
Lymphocytes Relative: 29 %
Lymphs Abs: 3 10*3/uL (ref 0.7–4.0)
MCH: 31.9 pg (ref 26.0–34.0)
MCHC: 33.9 g/dL (ref 30.0–36.0)
MCV: 94.1 fL (ref 80.0–100.0)
Monocytes Absolute: 0.5 10*3/uL (ref 0.1–1.0)
Monocytes Relative: 5 %
Neutro Abs: 6 10*3/uL (ref 1.7–7.7)
Neutrophils Relative %: 60 %
Platelets: 228 10*3/uL (ref 150–400)
RBC: 4.55 MIL/uL (ref 3.87–5.11)
RDW: 13.1 % (ref 11.5–15.5)
WBC: 10.1 10*3/uL (ref 4.0–10.5)
nRBC: 0 % (ref 0.0–0.2)

## 2020-10-10 LAB — URINALYSIS, ROUTINE W REFLEX MICROSCOPIC
Bilirubin Urine: NEGATIVE
Glucose, UA: NEGATIVE mg/dL
Hgb urine dipstick: NEGATIVE
Ketones, ur: NEGATIVE mg/dL
Leukocytes,Ua: NEGATIVE
Nitrite: NEGATIVE
Protein, ur: NEGATIVE mg/dL
Specific Gravity, Urine: 1.008 (ref 1.005–1.030)
pH: 5 (ref 5.0–8.0)

## 2020-10-10 LAB — PREGNANCY, URINE: Preg Test, Ur: NEGATIVE

## 2020-10-10 NOTE — ED Notes (Signed)
Pt has had chronic lower back pain. Pt has MRI results, and MD will speak with pt.

## 2020-10-10 NOTE — ED Notes (Signed)
Patient is being discharged from the Urgent Care and sent to the Emergency Department via EMS . Per Marquis Lunch, PA, patient is in need of higher level of care due to severe back pain, incontinence, severe loss of balance. Patient is aware and verbalizes understanding of plan of care.  Vitals:   10/10/20 1342  BP: (!) 153/100  Pulse: 85  Resp: 18  Temp: 98.9 F (37.2 C)  SpO2: 98%

## 2020-10-10 NOTE — ED Triage Notes (Signed)
Pt presents with back and leg pain xs 2-3 weeks. States pain was the worst yesterday. States ibuprofen and tizanidine only gave minimal relief.

## 2020-10-10 NOTE — ED Triage Notes (Signed)
Pt bib [facility] from UC. Pt has chronic back pain, yesterday was getting out of chair and pain worsened. Pt has taken tylenol and tizanidine w/o relief. Pt states she has had urinary incontinence at night for the past few weeks. Pt has numbness and tingling in both arms and legs. Pt states has been ambulatory, has had increased pain with walking. Pt denies bowel incontinence. Pain is in lower back radiating in bilateral legs, states it is worse on the right side.

## 2020-10-10 NOTE — ED Provider Notes (Signed)
[facility]    CSN: [id number] Arrival date & time: 10/10/20  1253      History   Chief Complaint Chief Complaint  Patient presents with  . Back Pain  . Leg Pain    HPI Diamond Brown is a 50 y.o. female.   HPI   Back Pain: Pt reports that for the past 2-3 weeks she has had lower back and bilateral leg pain. Symptoms worsened yesterday without known injury. She has been taking tizanidine and ibuprofen with only mild relief of symptoms. She has had some worsening incontinence, weakness, loss of balance and severe pain since yesterday. She states that she tried to go to work today but falls when she tries to walk on her own due to leg weakness and coordination weakness.    Past Medical History:  Diagnosis Date  . Agoraphobia   . Anxiety   . Brain tumor (benign) ([place])   . Diabetes mellitus without complication ([place])   . High cholesterol   . Hypertension   . Panic disorder   . PTSD (post-traumatic stress disorder)   . Restless leg syndrome     There are no problems to display for this patient.   History reviewed. No pertinent surgical history.  OB History    Gravida      Para      Term      Preterm      AB      Living  2     SAB      IAB      Ectopic      Multiple      Live Births               Home Medications    Prior to Admission medications   Medication Sig Start Date End Date Taking? Authorizing Provider  acebutolol (SECTRAL) 200 MG capsule Take 200 mg by mouth every evening.  08/31/19   [provider]  atenolol (TENORMIN) 50 MG tablet Take 50 mg by mouth daily. Patient not taking: Reported on 11/08/2019    [provider]  benzonatate (TESSALON) 100 MG capsule Take 1-2 capsules (100-200 mg total) by mouth 3 (three) times daily as needed for cough. Patient not taking: Reported on 11/08/2019 10/05/14   Tereasa Coop, PA-C  bimatoprost (LUMIGAN) 0.03 % ophthalmic solution Place 1 drop into both eyes at  bedtime.    [provider]  brimonidine-timolol (COMBIGAN) 0.2-0.5 % ophthalmic solution Place 1 drop into both eyes every 12 (twelve) hours.    [provider]  carbamazepine (TEGRETOL) 200 MG tablet Take 200 mg by mouth daily as needed (restless legs syndrome).     [provider]  Cetirizine HCl (ZYRTEC ALLERGY PO) Take 1 tablet by mouth daily as needed (allergies).    [provider]  chlorhexidine (PERIDEX) 0.12 % solution Use as directed 15 mLs in the mouth or throat 2 (two) times daily.  09/25/19   [provider]  citalopram (CELEXA) 40 MG tablet Take 40 mg by mouth daily.    [provider]  famotidine (PEPCID) 20 MG tablet Take 1 tablet (20 mg total) by mouth daily. 11/08/19 12/08/19  Eustaquio Maize, PA-C  famotidine (PEPCID) 20 MG tablet Take 1 tablet (20 mg total) by mouth daily. 11/08/19   Venter, Margaux, PA-C  fluticasone (FLONASE) 50 MCG/ACT nasal spray Place 2 sprays into both nostrils daily. Patient not taking: Reported on 11/08/2019 03/15/19   Henderly, Britni A,  PA-C  HYDROcodone-homatropine (HYCODAN) 5-1.5 MG/5ML syrup Take 5 mLs by mouth every 6 (six) hours as needed for cough. Patient not taking: Reported on 11/08/2019 03/15/19   Henderly, Britni A, PA-C  ibuprofen (ADVIL,MOTRIN) 600 MG tablet Take 1 tablet (600 mg total) by mouth every 8 (eight) hours as needed for fever, headache or moderate pain. Patient not taking: Reported on 11/08/2019 10/05/14   Tereasa Coop, PA-C  levocetirizine (XYZAL) 5 MG tablet Take 1 tablet (5 mg total) by mouth every evening. Patient not taking: Reported on 11/08/2019 10/05/14   Tereasa Coop, PA-C  lisinopril (ZESTRIL) 10 MG tablet Take 10 mg by mouth daily. 10/25/19   [provider]  lovastatin (MEVACOR) 40 MG tablet Take 40 mg by mouth daily. 10/25/19   [provider]  LUMIGAN 0.01 % SOLN Place 1 drop into both eyes at bedtime.  06/15/19   [provider]  metFORMIN  (GLUCOPHAGE) 1000 MG tablet Take 1,000 mg by mouth 2 (two) times daily with a meal. Patient not taking: Reported on 11/08/2019    [provider]  metFORMIN (GLUCOPHAGE-XR) 500 MG 24 hr tablet Take 1,000 mg by mouth 2 (two) times daily. 10/25/19   [provider]  ondansetron (ZOFRAN) 4 MG tablet Take 4 mg by mouth 2 (two) times daily as needed for nausea or vomiting.  10/31/19   [provider]  pantoprazole (PROTONIX) 20 MG tablet Take 1 tablet (20 mg total) by mouth daily. 11/08/19 12/08/19  Alroy Bailiff, Margaux, PA-C  pantoprazole (PROTONIX) 20 MG tablet Take 1 tablet (20 mg total) by mouth daily. 11/08/19 12/08/19  Eustaquio Maize, PA-C    Family History Family History  Problem Relation Age of Onset  . Cancer Mother   . Hypertension Mother   . Diabetes Mother   . Glaucoma Father   . Heart failure Father     Social History Social History   Tobacco Use  . Smoking status: Never Smoker  . Smokeless tobacco: Never Used  Substance Use Topics  . Alcohol use: Yes    Comment: Social  . Drug use: No     Allergies   Fanapt [iloperidone]   Review of Systems Review of Systems  As stated above in HPI Physical Exam Triage Vital Signs ED Triage Vitals  Enc Vitals Group     BP 10/10/20 1342 (!) 153/100     Pulse Rate 10/10/20 1342 85     Resp 10/10/20 1342 18     Temp 10/10/20 1342 98.9 F (37.2 C)     Temp Source 10/10/20 1342 Oral     SpO2 10/10/20 1342 98 %     Weight --      Height --      Head Circumference --      Peak Flow --      Pain Score 10/10/20 1339 8     Pain Loc --      Pain Edu? --      Excl. in [place]? --    No data found.  Updated Vital Signs BP (!) 153/100 (BP Location: Right Arm)   Pulse 85   Temp 98.9 F (37.2 C) (Oral)   Resp 18   LMP 10/02/2020   SpO2 98%   Physical Exam Vitals and nursing note reviewed.  Constitutional:      General: She is in acute distress (tearful; cannot get up out of examination chair).      Appearance: Normal appearance. She is not ill-appearing, toxic-appearing or  diaphoretic.  HENT:     Head: Normocephalic and atraumatic.  Musculoskeletal:     Cervical back: Normal, normal range of motion and neck supple. No rigidity.     Thoracic back: Tenderness and bony tenderness present. Decreased range of motion.     Lumbar back: Tenderness and bony tenderness present. Decreased range of motion. Positive right straight leg raise test and positive left straight leg raise test.  Neurological:     Mental Status: She is alert and oriented to person, place, and time.     Coordination: Coordination normal.     Gait: Gait abnormal.     Deep Tendon Reflexes: Reflexes normal.  Psychiatric:     Comments: tearful      UC Treatments / Results  Labs (all labs ordered are listed, but only abnormal results are displayed) Labs Reviewed - No data to display  EKG   Radiology No results found.  Procedures Procedures (including critical care time)  Medications Ordered in UC Medications - No data to display  Initial Impression / Assessment and Plan / UC Course  I have reviewed the triage vital signs and the nursing notes.  Pertinent labs & imaging results that were available during my care of the patient were reviewed by me and considered in my medical decision making (see chart for details).     New.  I discussed with patient my concerns regarding her symptoms concerning for spinal abnormalities which she agrees with.  She would like to proceed forward to the emergency room but I do not feel comfortable with her driving herself.  She states that she does not have any other option for transportation and so we have elected to call EMS. Final Clinical Impressions(s) / UC Diagnoses   Final diagnoses:  None   Discharge Instructions   None    ED Prescriptions    None     PDMP not reviewed this encounter.   Hughie Closs, [place] 10/10/20 1536

## 2020-10-10 NOTE — ED Provider Notes (Addendum)
Emergency Medicine Provider Triage Evaluation Note  Diamond Brown , a 50 y.o. female  was evaluated in triage.  Pt complains of lumbar back pain.  Patient reports that she has had chronic low back pain however pain has become worse in the last 2 to 3 weeks.  Patient reports that pain became unbearable yesterday after seated to standing position.  She reports that radiates into bilateral lower legs.  Patient has been taking tizanidine and ibuprofen with minimal relief of symptoms.  Patient endorses some worsening incontinence, weakness of bilateral lower legs, loss of balance, and tingling sensation to bilateral lower legs.  Patient denies any recent falls or injuries.  Patient was seen at urgent care earlier today and sent to emergency department due to concern for "spinal abnormalities."  Reports that she was able to urinate earlier today without difficulty.  Patient denies any IV drug use.  Review of Systems  Positive: Lumbar back pain, weakness to bilateral lower extremities, tingling to bilateral lower extremities Negative: Fevers, chills,  Physical Exam  Pulse 89   Temp 99.5 F (37.5 C)   Resp 16   LMP 10/02/2020   SpO2 100%  Gen:   Awake, no distress , uncomfortable due to complaints of pain Resp:  Normal effort  MSK:   Moves bilateral upper extremities without difficulty, able to move lower extremities however complains of increased back pain with movement, patient has tenderness to midline lumbar back as well as bilateral paraspinal muscles of lumbar back, no deformity or step-off noted to cervical, thoracic, or lumbar spine, patient has sensation to light touch throughout bilateral lower extremities Other:    Medical Decision Making  Medically screening exam initiated at 4:14 PM.  Appropriate orders placed.  Lilana PALLIE SWIGERT was informed that the remainder of the evaluation will be completed by another provider, this initial triage assessment does not replace that evaluation,  and the importance of remaining in the ED until their evaluation is complete.  The patient appears stable so that the remainder of the work up may be completed by another provider.      Loni Beckwith, PA-C 10/10/20 1618    Loni Beckwith, PA-C 10/10/20 1625    Loni Beckwith, PA-C 10/10/20 1627    Orpah Greek, MD 10/11/20 0045

## 2020-10-11 DIAGNOSIS — M5441 Lumbago with sciatica, right side: Secondary | ICD-10-CM | POA: Diagnosis not present

## 2020-10-11 MED ORDER — PREDNISONE 20 MG PO TABS
ORAL_TABLET | ORAL | 0 refills | Status: DC
Start: 2020-10-11 — End: 2021-02-23

## 2020-10-11 MED ORDER — ONDANSETRON HCL 4 MG/2ML IJ SOLN
4.0000 mg | Freq: Once | INTRAMUSCULAR | Status: AC
  Administered 2020-10-11: 4 mg via INTRAVENOUS
  Filled 2020-10-11: qty 2

## 2020-10-11 MED ORDER — KETOROLAC TROMETHAMINE 30 MG/ML IJ SOLN
15.0000 mg | Freq: Once | INTRAMUSCULAR | Status: AC
  Administered 2020-10-11: 15 mg via INTRAVENOUS
  Filled 2020-10-11: qty 1

## 2020-10-11 MED ORDER — METHOCARBAMOL 500 MG PO TABS
500.0000 mg | ORAL_TABLET | Freq: Three times a day (TID) | ORAL | 0 refills | Status: DC | PRN
Start: 2020-10-11 — End: 2021-12-10

## 2020-10-11 MED ORDER — OXYCODONE-ACETAMINOPHEN 5-325 MG PO TABS: 1.0000 | ORAL_TABLET | ORAL | 0 refills | Status: DC | PRN

## 2020-10-11 MED ORDER — HYDROMORPHONE HCL 1 MG/ML IJ SOLN
1.0000 mg | Freq: Once | INTRAMUSCULAR | Status: AC
  Administered 2020-10-11: 1 mg via INTRAVENOUS
  Filled 2020-10-11: qty 1

## 2020-10-11 NOTE — ED Provider Notes (Signed)
[facility] EMERGENCY DEPARTMENT Provider Note   CSN: [id number] Arrival date & time: 10/10/20  1609     History Chief Complaint  Patient presents with  . Back Pain    Diamond Brown is a 50 y.o. female.  Patient presents to the emergency department for evaluation of low back pain.  Patient reports that she has been experiencing pain in her lower back for quite some time.  This has been somewhat chronic but manageable to her.  In the past she has used ibuprofen and gone to the chiropractor with improvement.  Recently pain has become much more severe.  She has had some incontinence of urine and her legs give out when she walks.        Past Medical History:  Diagnosis Date  . Agoraphobia   . Anxiety   . Brain tumor (benign) ([place])   . Diabetes mellitus without complication ([place])   . High cholesterol   . Hypertension   . Panic disorder   . PTSD (post-traumatic stress disorder)   . Restless leg syndrome     There are no problems to display for this patient.   History reviewed. No pertinent surgical history.   OB History    Gravida      Para      Term      Preterm      AB      Living  2     SAB      IAB      Ectopic      Multiple      Live Births              Family History  Problem Relation Age of Onset  . Cancer Mother   . Hypertension Mother   . Diabetes Mother   . Glaucoma Father   . Heart failure Father     Social History   Tobacco Use  . Smoking status: Never Smoker  . Smokeless tobacco: Never Used  Vaping Use  . Vaping Use: Never used  Substance Use Topics  . Alcohol use: Yes    Comment: Social  . Drug use: No    Home Medications Prior to Admission medications   Medication Sig Start Date End Date Taking? Authorizing Provider  acebutolol (SECTRAL) 200 MG capsule Take 200 mg by mouth every evening.  08/31/19  Yes [provider]  Aspirin-Caffeine 845-65 MG PACK Take 1 packet by mouth daily as  needed (headache/pain).   Yes [provider]  bimatoprost (LUMIGAN) 0.03 % ophthalmic solution Place 1 drop into both eyes at bedtime.   Yes [provider]  brimonidine-timolol (COMBIGAN) 0.2-0.5 % ophthalmic solution Place 1 drop into both eyes every 12 (twelve) hours.   Yes [provider]  carbamazepine (TEGRETOL) 200 MG tablet Take 200 mg by mouth daily as needed (restless legs syndrome).    Yes [provider]  Cetirizine HCl (ZYRTEC ALLERGY PO) Take 1 tablet by mouth daily as needed (allergies).   Yes [provider]  chlorhexidine (PERIDEX) 0.12 % solution Use as directed 15 mLs in the mouth or throat 2 (two) times daily.  09/25/19  Yes [provider]  citalopram (CELEXA) 40 MG tablet Take 40 mg by mouth every evening.   Yes [provider]  famotidine (PEPCID) 20 MG tablet Take 1 tablet (20 mg total) by mouth daily. Patient taking differently: Take 20 mg by mouth daily as needed for heartburn or indigestion. 11/08/19  12/08/19 Yes Venter, Margaux, PA-C  ibuprofen (ADVIL) 200 MG tablet Take 800 mg by mouth every 6 (six) hours as needed for headache or moderate pain.   Yes [provider]  lisinopril (ZESTRIL) 10 MG tablet Take 10 mg by mouth at bedtime. 10/25/19  Yes [provider]  lovastatin (MEVACOR) 40 MG tablet Take 40 mg by mouth at bedtime. 10/25/19  Yes [provider]  metFORMIN (GLUCOPHAGE-XR) 500 MG 24 hr tablet Take 1,000 mg by mouth 2 (two) times daily. 10/25/19  Yes [provider]  methocarbamol (ROBAXIN) 500 MG tablet Take 1 tablet (500 mg total) by mouth every 8 (eight) hours as needed for muscle spasms. 10/11/20  Yes Alexzia Kasler, Gwenyth Allegra, MD  ondansetron (ZOFRAN) 4 MG tablet Take 4 mg by mouth 2 (two) times daily as needed for nausea or vomiting.  10/31/19  Yes [provider]  oxyCODONE-acetaminophen (PERCOCET) 5-325 MG tablet Take 1-2 tablets by mouth every 4 (four) hours  as needed. 10/11/20  Yes Samanvitha Germany, Gwenyth Allegra, MD  predniSONE (DELTASONE) 20 MG tablet 3 tabs po daily x 3 days, then 2 tabs x 3 days, then 1.5 tabs x 3 days, then 1 tab x 3 days, then 0.5 tabs x 3 days 10/11/20  Yes Girlie Veltri, Gwenyth Allegra, MD  atenolol (TENORMIN) 50 MG tablet Take 50 mg by mouth daily. Patient not taking: No sig reported    [provider]  benzonatate (TESSALON) 100 MG capsule Take 1-2 capsules (100-200 mg total) by mouth 3 (three) times daily as needed for cough. Patient not taking: No sig reported 10/05/14   Tereasa Coop, PA-C  fluticasone [facility]) 50 MCG/ACT nasal spray Place 2 sprays into both nostrils daily. Patient not taking: No sig reported 03/15/19   Henderly, Britni A, PA-C  HYDROcodone-homatropine (HYCODAN) 5-1.5 MG/5ML syrup Take 5 mLs by mouth every 6 (six) hours as needed for cough. Patient not taking: No sig reported 03/15/19   Henderly, Britni A, PA-C  ibuprofen (ADVIL,MOTRIN) 600 MG tablet Take 1 tablet (600 mg total) by mouth every 8 (eight) hours as needed for fever, headache or moderate pain. Patient not taking: No sig reported 10/05/14   Tereasa Coop, PA-C  levocetirizine (XYZAL) 5 MG tablet Take 1 tablet (5 mg total) by mouth every evening. Patient not taking: No sig reported 10/05/14   Tereasa Coop, PA-C  metFORMIN (GLUCOPHAGE) 1000 MG tablet Take 1,000 mg by mouth 2 (two) times daily with a meal. Patient not taking: Reported on 10/11/2020    [provider]  pantoprazole (PROTONIX) 20 MG tablet Take 1 tablet (20 mg total) by mouth daily. Patient not taking: Reported on 10/11/2020 11/08/19 12/08/19  Eustaquio Maize, PA-C  pantoprazole (PROTONIX) 20 MG tablet Take 1 tablet (20 mg total) by mouth daily. Patient not taking: Reported on 10/11/2020 11/08/19 12/08/19  Eustaquio Maize, PA-C    Allergies    Fanapt [iloperidone]  Review of Systems   Review of Systems  Musculoskeletal: Positive for back pain.  All other systems reviewed  and are negative.   Physical Exam Updated Vital Signs BP (!) 141/84   Pulse 76   Temp 99.5 F (37.5 C)   Resp 16   LMP 10/02/2020   SpO2 96%   Physical Exam Vitals and nursing note reviewed.  Constitutional:      General: She is not in acute distress.    Appearance: Normal appearance. She is well-developed.  HENT:     Head: Normocephalic and atraumatic.  Right Ear: Hearing normal.     Left Ear: Hearing normal.     Nose: Nose normal.  Eyes:     Conjunctiva/sclera: Conjunctivae normal.     Pupils: Pupils are equal, round, and reactive to light.  Cardiovascular:     Rate and Rhythm: Regular rhythm.     Heart sounds: S1 normal and S2 normal. No murmur heard. No friction rub. No gallop.   Pulmonary:     Effort: Pulmonary effort is normal. No respiratory distress.     Breath sounds: Normal breath sounds.  Chest:     Chest wall: No tenderness.  Abdominal:     General: Bowel sounds are normal.     Palpations: Abdomen is soft.     Tenderness: There is no abdominal tenderness. There is no guarding or rebound. Negative signs include Murphy's sign and McBurney's sign.     Hernia: No hernia is present.  Musculoskeletal:     Cervical back: Normal range of motion and neck supple.     Lumbar back: Tenderness present. Positive right straight leg raise test and positive left straight leg raise test.     Comments: Strength equal in both lower extremities.  Able to keep legs raised off the bed against gravity but this causes significant pain in her back.  No saddle anesthesia.  No foot drop.  Skin:    General: Skin is warm and dry.     Findings: No rash.  Neurological:     Mental Status: She is alert and oriented to person, place, and time.     GCS: GCS eye subscore is 4. GCS verbal subscore is 5. GCS motor subscore is 6.     Cranial Nerves: No cranial nerve deficit.     Sensory: No sensory deficit.     Coordination: Coordination normal.  Psychiatric:        Speech: Speech  normal.        Behavior: Behavior normal.        Thought Content: Thought content normal.     ED Results / Procedures / Treatments   Labs (all labs ordered are listed, but only abnormal results are displayed) Labs Reviewed  URINALYSIS, ROUTINE W REFLEX MICROSCOPIC - Abnormal; Notable for the following components:      Result Value   Color, Urine STRAW (*)    All other components within normal limits  COMPREHENSIVE METABOLIC PANEL  CBC WITH DIFFERENTIAL/PLATELET  PREGNANCY, URINE    EKG None  Radiology MR LUMBAR SPINE WO CONTRAST  Result Date: 10/10/2020 CLINICAL DATA:  Initial evaluation for acute low back pain. EXAM: MRI LUMBAR SPINE WITHOUT CONTRAST TECHNIQUE: Multiplanar, multisequence MR imaging of the lumbar spine was performed. No intravenous contrast was administered. COMPARISON:  None. FINDINGS: Segmentation: Standard. Lowest well-formed disc space labeled the L5-S1 level. Alignment: Straightening of the normal lumbar lordosis. No listhesis. Vertebrae: Vertebral body height maintained without acute or chronic fracture. Bone marrow signal intensity diffusely heterogeneous without or worrisome osseous lesion. Discogenic reactive endplate change present about the L4-5 interspace, greater on the right. No other abnormal marrow edema. Conus medullaris and cauda equina: Conus extends to the L2 level. Conus and cauda equina appear normal. Paraspinal and other soft tissues: Paraspinous soft tissues within normal limits. Multiple scattered benign appearing T2 hyperintense cyst noted about the partially visualized kidneys. Retroaortic left renal vein noted. Visualized visceral structures otherwise unremarkable. Disc levels: L1-2:  Unremarkable. L2-3:  Unremarkable. L3-4: Disc desiccation with mild disc bulge. Superimposed left foraminal to extraforaminal  disc protrusion contacts the exiting left L3 nerve root (series 8, image 16). Associated annular fissure. No spinal stenosis. Mild left  foraminal narrowing. Right neural foramina remains patent. L4-5: Degenerative intervertebral disc space narrowing with disc desiccation and diffuse disc bulge. Associated reactive endplate change. Moderate-sized central disc extrusion indents the ventral thecal sac, slightly asymmetric to the right. Resultant moderate canal and right lateral recess stenosis. Either of the descending L5 nerve roots could be affected, particularly on the right. Superimposed mild facet hypertrophy. Foramina remain patent. L5-S1: Degenerative intervertebral disc space narrowing with diffuse disc bulge and disc desiccation. Associated reactive endplate spurring. Small left subarticular disc protrusion closely approximates the descending left S1 nerve root without frank impingement (series 8, image 27). Mild facet hypertrophy. No significant spinal stenosis. Mild bilateral L5 foraminal narrowing. IMPRESSION: 1. Moderate-sized central disc extrusion at L4-5 with resultant moderate spinal stenosis. Either of the descending L5 nerve roots could be affected, particularly on the right. 2. Small left foraminal to extraforaminal disc protrusion at L3-4, contacting and potentially affecting the exiting left L3 nerve root. 3. Left subarticular disc protrusion at L5-S1, closely approximating and potentially irritating the descending left S1 nerve root. Electronically Signed   By: Jeannine Boga M.D.   On: 10/10/2020 19:06    Procedures Procedures   Medications Ordered in ED Medications  HYDROmorphone (DILAUDID) injection 1 mg (1 mg Intravenous Given 10/11/20 0052)  ondansetron (ZOFRAN) injection 4 mg (4 mg Intravenous Given 10/11/20 0054)  ketorolac (TORADOL) 30 MG/ML injection 15 mg (15 mg Intravenous Given 10/11/20 0049)    ED Course  I have reviewed the triage vital signs and the nursing notes.  Pertinent labs & imaging results that were available during my care of the patient were reviewed by me and considered in my medical  decision making (see chart for details).    MDM Rules/Calculators/A&P                          Patient presents to the emergency department for evaluation of low back pain.  Patient has a history of chronic back pain but has had a change in the intensity of her pain recently.  Examination reveals equal strength bilaterally.  She has some painful inhibition but does not appear to have any decreased strength in the lower extremities.  Sensation is normal.  No foot drop, no saddle anesthesia.  Examination does not raise concern for cauda equina.  MRI was performed and does not show any emergent findings.  She does have broad-based disc desiccation that would explain her symptoms.  Patient has achieved much better pain control here in the emergency department.  Will discharge with corticosteroids, analgesia and follow-up with spine surgery.  Final Clinical Impression(s) / ED Diagnoses Final diagnoses:  Acute bilateral low back pain with bilateral sciatica    Rx / DC Orders ED Discharge Orders         Ordered    predniSONE (DELTASONE) 20 MG tablet        10/11/20 0432    oxyCODONE-acetaminophen (PERCOCET) 5-325 MG tablet  Every 4 hours PRN        10/11/20 0432    methocarbamol (ROBAXIN) 500 MG tablet  Every 8 hours PRN        10/11/20 0432           Orpah Greek, MD 10/11/20 [id number]

## 2020-10-16 DIAGNOSIS — E1165 Type 2 diabetes mellitus with hyperglycemia: Secondary | ICD-10-CM | POA: Diagnosis not present

## 2020-10-16 DIAGNOSIS — E782 Mixed hyperlipidemia: Secondary | ICD-10-CM | POA: Diagnosis not present

## 2020-10-16 DIAGNOSIS — R5383 Other fatigue: Secondary | ICD-10-CM | POA: Diagnosis not present

## 2020-10-16 DIAGNOSIS — I10 Essential (primary) hypertension: Secondary | ICD-10-CM | POA: Diagnosis not present

## 2020-10-21 DIAGNOSIS — M5126 Other intervertebral disc displacement, lumbar region: Secondary | ICD-10-CM | POA: Diagnosis not present

## 2020-10-23 DIAGNOSIS — E1165 Type 2 diabetes mellitus with hyperglycemia: Secondary | ICD-10-CM | POA: Diagnosis not present

## 2020-10-23 DIAGNOSIS — E782 Mixed hyperlipidemia: Secondary | ICD-10-CM | POA: Diagnosis not present

## 2020-10-23 DIAGNOSIS — Z0000 Encounter for general adult medical examination without abnormal findings: Secondary | ICD-10-CM | POA: Diagnosis not present

## 2020-10-23 DIAGNOSIS — I10 Essential (primary) hypertension: Secondary | ICD-10-CM | POA: Diagnosis not present

## 2020-10-26 DIAGNOSIS — Z20822 Contact with and (suspected) exposure to covid-19: Secondary | ICD-10-CM | POA: Diagnosis not present

## 2020-11-05 DIAGNOSIS — E782 Mixed hyperlipidemia: Secondary | ICD-10-CM | POA: Diagnosis not present

## 2020-11-05 DIAGNOSIS — I10 Essential (primary) hypertension: Secondary | ICD-10-CM | POA: Diagnosis not present

## 2020-11-05 DIAGNOSIS — M5416 Radiculopathy, lumbar region: Secondary | ICD-10-CM | POA: Diagnosis not present

## 2020-11-05 DIAGNOSIS — Z01818 Encounter for other preprocedural examination: Secondary | ICD-10-CM | POA: Diagnosis not present

## 2020-11-05 DIAGNOSIS — E1165 Type 2 diabetes mellitus with hyperglycemia: Secondary | ICD-10-CM | POA: Diagnosis not present

## 2020-11-14 DIAGNOSIS — M5126 Other intervertebral disc displacement, lumbar region: Secondary | ICD-10-CM | POA: Diagnosis not present

## 2020-12-17 DIAGNOSIS — Z0100 Encounter for examination of eyes and vision without abnormal findings: Secondary | ICD-10-CM | POA: Diagnosis not present

## 2020-12-29 DIAGNOSIS — H401132 Primary open-angle glaucoma, bilateral, moderate stage: Secondary | ICD-10-CM | POA: Diagnosis not present

## 2020-12-29 DIAGNOSIS — H2513 Age-related nuclear cataract, bilateral: Secondary | ICD-10-CM | POA: Diagnosis not present

## 2021-02-23 ENCOUNTER — Ambulatory Visit (HOSPITAL_COMMUNITY)
Admission: EM | Admit: 2021-02-23 | Discharge: 2021-02-23 | Disposition: A | Discharge status: Home / Self Care | Payer: Medicare Other | Attending: Emergency Medicine | Admitting: Emergency Medicine

## 2021-02-23 ENCOUNTER — Encounter (HOSPITAL_COMMUNITY): Payer: Self-pay | Admitting: *Deleted

## 2021-02-23 ENCOUNTER — Other Ambulatory Visit: Payer: Self-pay

## 2021-02-23 DIAGNOSIS — J019 Acute sinusitis, unspecified: Secondary | ICD-10-CM

## 2021-02-23 DIAGNOSIS — R059 Cough, unspecified: Secondary | ICD-10-CM

## 2021-02-23 MED ORDER — FLUCONAZOLE 150 MG PO TABS
150.0000 mg | ORAL_TABLET | Freq: Once | ORAL | 0 refills | Status: AC
Start: 2021-02-23 — End: 2021-02-23

## 2021-02-23 MED ORDER — AMOXICILLIN-POT CLAVULANATE 875-125 MG PO TABS: 1.0000 | ORAL_TABLET | Freq: Two times a day (BID) | ORAL | 0 refills | Status: AC

## 2021-02-23 MED ORDER — PREDNISONE 20 MG PO TABS: 40.0000 mg | ORAL_TABLET | Freq: Every day | ORAL | 0 refills | Status: AC

## 2021-02-23 MED ORDER — BENZONATATE 200 MG PO CAPS
200.0000 mg | ORAL_CAPSULE | Freq: Three times a day (TID) | ORAL | 0 refills | Status: AC | PRN
Start: 2021-02-23 — End: 2021-03-02

## 2021-02-23 NOTE — ED Triage Notes (Signed)
Pt reports sinus infection ,sore throat and era pain LT for one week.

## 2021-02-23 NOTE — ED Provider Notes (Signed)
[facility] [facility]    CSN: [id number] Arrival date & time: 02/23/21  1256      History   Chief Complaint Chief Complaint  Patient presents with   Facial Pain   Sore Throat    HPI Diamond Brown is a 50 y.o. female history of hypertension, presenting today for evaluation of sinus pressure and congestion.  Reports symptoms for over 1 week.  Continued pressure behind eyes, persistent headaches associated congestion drainage and cough.  Beginning to feel wheezing.  HPI  Past Medical History:  Diagnosis Date   Agoraphobia    Anxiety    Brain tumor (benign) ([place])    Diabetes mellitus without complication ([facility])    High cholesterol    Hypertension    Panic disorder    PTSD (post-traumatic stress disorder)    Restless leg syndrome     There are no problems to display for this patient.   History reviewed. No pertinent surgical history.  OB History     Gravida      Para      Term      Preterm      AB      Living  2      SAB      IAB      Ectopic      Multiple      Live Births               Home Medications    Prior to Admission medications   Medication Sig Start Date End Date Taking? Authorizing Provider  amoxicillin-clavulanate (AUGMENTIN) 875-125 MG tablet Take 1 tablet by mouth every 12 (twelve) hours for 7 days. 02/23/21 03/02/21 Yes Nathanie Ottley C, PA-Brown  benzonatate (TESSALON) 200 MG capsule Take 1 capsule (200 mg total) by mouth 3 (three) times daily as needed for up to 7 days for cough. 02/23/21 03/02/21 Yes Diamond Chaudhari C, PA-Brown  fluconazole (DIFLUCAN) 150 MG tablet Take 1 tablet (150 mg total) by mouth once for 1 dose. 02/23/21 02/23/21 Yes Diamond Pitones C, PA-Brown  predniSONE (DELTASONE) 20 MG tablet Take 2 tablets (40 mg total) by mouth daily with breakfast for 5 days. 02/23/21 02/28/21 Yes Diamond Corales C, PA-Brown  acebutolol (SECTRAL) 200 MG capsule Take 200 mg by mouth every evening.  08/31/19   [provider]   Aspirin-Caffeine 845-65 MG PACK Take 1 packet by mouth daily as needed (headache/pain).    [provider]  atenolol (TENORMIN) 50 MG tablet Take 50 mg by mouth daily. Patient not taking: No sig reported    [provider]  bimatoprost (LUMIGAN) 0.03 % ophthalmic solution Place 1 drop into both eyes at bedtime.    [provider]  brimonidine-timolol (COMBIGAN) 0.2-0.5 % ophthalmic solution Place 1 drop into both eyes every 12 (twelve) hours.    [provider]  carbamazepine (TEGRETOL) 200 MG tablet Take 200 mg by mouth daily as needed (restless legs syndrome).     [provider]  Cetirizine HCl (ZYRTEC ALLERGY PO) Take 1 tablet by mouth daily as needed (allergies).    [provider]  chlorhexidine (PERIDEX) 0.12 % solution Use as directed 15 mLs in the mouth or throat 2 (two) times daily.  09/25/19   [provider]  citalopram (CELEXA) 40 MG tablet Take 40 mg by mouth every evening.    [provider]  famotidine (PEPCID) 20 MG tablet Take 1 tablet (20 mg total) by mouth daily. Patient taking differently:  Take 20 mg by mouth daily as needed for heartburn or indigestion. 11/08/19 12/08/19  Venter, Margaux, PA-Brown  lisinopril (ZESTRIL) 10 MG tablet Take 10 mg by mouth at bedtime. 10/25/19   [provider]  lovastatin (MEVACOR) 40 MG tablet Take 40 mg by mouth at bedtime. 10/25/19   [provider]  metFORMIN (GLUCOPHAGE) 1000 MG tablet Take 1,000 mg by mouth 2 (two) times daily with a meal. Patient not taking: Reported on 10/11/2020    [provider]  metFORMIN (GLUCOPHAGE-XR) 500 MG 24 hr tablet Take 1,000 mg by mouth 2 (two) times daily. 10/25/19   [provider]  methocarbamol (ROBAXIN) 500 MG tablet Take 1 tablet (500 mg total) by mouth every 8 (eight) hours as needed for muscle spasms. 10/11/20   Orpah Greek, MD  ondansetron (ZOFRAN) 4 MG tablet Take 4 mg by mouth 2 (two) times  daily as needed for nausea or vomiting.  10/31/19   [provider]  oxyCODONE-acetaminophen (PERCOCET) 5-325 MG tablet Take 1-2 tablets by mouth every 4 (four) hours as needed. 10/11/20   Orpah Greek, MD    Family History Family History  Problem Relation Age of Onset   Cancer Mother    Hypertension Mother    Diabetes Mother    Glaucoma Father    Heart failure Father     Social History Social History   Tobacco Use   Smoking status: Never   Smokeless tobacco: Never  Vaping Use   Vaping Use: Never used  Substance Use Topics   Alcohol use: Yes    Comment: Social   Drug use: No     Allergies   Fanapt [iloperidone]   Review of Systems Review of Systems  Constitutional:  Negative for activity change, appetite change, chills, fatigue and fever.  HENT:  Positive for congestion, rhinorrhea and sinus pressure. Negative for ear pain, sore throat and trouble swallowing.   Eyes:  Negative for discharge and redness.  Respiratory:  Positive for cough. Negative for chest tightness and shortness of breath.   Cardiovascular:  Negative for chest pain.  Gastrointestinal:  Negative for abdominal pain, diarrhea, nausea and vomiting.  Musculoskeletal:  Negative for myalgias.  Skin:  Negative for rash.  Neurological:  Negative for dizziness, light-headedness and headaches.    Physical Exam Triage Vital Signs ED Triage Vitals  Enc Vitals Group     BP 02/23/21 1435 (!) 143/74     Pulse Rate 02/23/21 1435 86     Resp 02/23/21 1435 18     Temp 02/23/21 1435 98.6 F (37 Brown)     Temp src --      SpO2 02/23/21 1435 98 %     Weight --      Height --      Head Circumference --      Peak Flow --      Pain Score 02/23/21 1431 6     Pain Loc --      Pain Edu? --      Excl. in [place]? --    No data found.  Updated Vital Signs BP (!) 143/74   Pulse 86   Temp 98.6 F (37 Brown)   Resp 18   LMP 02/09/2021   SpO2 98%   Visual Acuity Right Eye Distance:   Left Eye  Distance:   Bilateral Distance:    Right Eye Near:   Left Eye Near:    Bilateral Near:     Physical Exam Vitals  and nursing note reviewed.  Constitutional:      Appearance: She is well-developed.     Comments: No acute distress  HENT:     Head: Normocephalic and atraumatic.     Ears:     Comments: Bilateral ears without tenderness to palpation of external auricle, tragus and mastoid, EAC's without erythema or swelling, TM's with good bony landmarks and cone of light. Non erythematous.      Nose: Nose normal.     Mouth/Throat:     Comments: Oral mucosa pink and moist, no tonsillar enlargement or exudate. Posterior pharynx patent and nonerythematous, no uvula deviation or swelling. Normal phonation.  Eyes:     Conjunctiva/sclera: Conjunctivae normal.  Cardiovascular:     Rate and Rhythm: Normal rate.  Pulmonary:     Effort: Pulmonary effort is normal. No respiratory distress.     Comments: Breathing comfortably at rest, CTABL, no wheezing, rales or other adventitious sounds auscultated  Abdominal:     General: There is no distension.  Musculoskeletal:        General: Normal range of motion.     Cervical back: Neck supple.  Skin:    General: Skin is warm and dry.  Neurological:     Mental Status: She is alert and oriented to person, place, and time.     UC Treatments / Results  Labs (all labs ordered are listed, but only abnormal results are displayed) Labs Reviewed - No data to display  EKG   Radiology No results found.  Procedures Procedures (including critical care time)  Medications Ordered in UC Medications - No data to display  Initial Impression / Assessment and Plan / UC Course  I have reviewed the triage vital signs and the nursing notes.  Pertinent labs & imaging results that were available during my care of the patient were reviewed by me and considered in my medical decision making (see chart for details).     Treating for sinusitis-Augmentin  x1 week, prednisone x5 days, continue symptomatic and supportive care of cough and congestion  Discussed strict return precautions. Patient verbalized understanding and is agreeable with plan.  Final Clinical Impressions(s) / UC Diagnoses   Final diagnoses:  Acute sinusitis with symptoms > 10 days  Cough     Discharge Instructions      Augmentin twice daily x1 week, Diflucan if needed for yeast infection after antibiotic use Prednisone course 40 mg daily x5 days-take with food and earlier in the day if possible Continue Mucinex Tessalon/benzonatate every 8 hours for cough Rest and fluids Honey and hot tea Follow-up if not improving or worsening     ED Prescriptions     Medication Sig Dispense Auth. Provider   amoxicillin-clavulanate (AUGMENTIN) 875-125 MG tablet Take 1 tablet by mouth every 12 (twelve) hours for 7 days. 14 tablet Diamond Mcculley C, PA-Brown   fluconazole (DIFLUCAN) 150 MG tablet Take 1 tablet (150 mg total) by mouth once for 1 dose. 2 tablet Diamond Weyman C, PA-Brown   benzonatate (TESSALON) 200 MG capsule Take 1 capsule (200 mg total) by mouth 3 (three) times daily as needed for up to 7 days for cough. 28 capsule Diamond Wardrip C, PA-Brown   predniSONE (DELTASONE) 20 MG tablet Take 2 tablets (40 mg total) by mouth daily with breakfast for 5 days. 10 tablet Diamond Brown, [place] C, PA-Brown      PDMP not reviewed this encounter.   Diamond Brown, Diamond Brown 02/23/21 1557

## 2021-02-23 NOTE — Discharge Instructions (Addendum)
Augmentin twice daily x1 week, Diflucan if needed for yeast infection after antibiotic use Prednisone course 40 mg daily x5 days-take with food and earlier in the day if possible Continue Mucinex Tessalon/benzonatate every 8 hours for cough Rest and fluids Honey and hot tea Follow-up if not improving or worsening

## 2021-03-05 DIAGNOSIS — Z0000 Encounter for general adult medical examination without abnormal findings: Secondary | ICD-10-CM | POA: Diagnosis not present

## 2021-03-05 DIAGNOSIS — E782 Mixed hyperlipidemia: Secondary | ICD-10-CM | POA: Diagnosis not present

## 2021-03-05 DIAGNOSIS — E1165 Type 2 diabetes mellitus with hyperglycemia: Secondary | ICD-10-CM | POA: Diagnosis not present

## 2021-03-05 DIAGNOSIS — I10 Essential (primary) hypertension: Secondary | ICD-10-CM | POA: Diagnosis not present

## 2021-03-10 DIAGNOSIS — M5126 Other intervertebral disc displacement, lumbar region: Secondary | ICD-10-CM | POA: Diagnosis not present

## 2021-03-12 DIAGNOSIS — E1165 Type 2 diabetes mellitus with hyperglycemia: Secondary | ICD-10-CM | POA: Diagnosis not present

## 2021-03-12 DIAGNOSIS — Z23 Encounter for immunization: Secondary | ICD-10-CM | POA: Diagnosis not present

## 2021-03-12 DIAGNOSIS — I10 Essential (primary) hypertension: Secondary | ICD-10-CM | POA: Diagnosis not present

## 2021-03-12 DIAGNOSIS — E782 Mixed hyperlipidemia: Secondary | ICD-10-CM | POA: Diagnosis not present

## 2021-03-12 DIAGNOSIS — L918 Other hypertrophic disorders of the skin: Secondary | ICD-10-CM | POA: Diagnosis not present

## 2021-06-08 ENCOUNTER — Ambulatory Visit: Payer: Medicare Other | Admitting: Dermatology

## 2021-08-13 DIAGNOSIS — I10 Essential (primary) hypertension: Secondary | ICD-10-CM | POA: Diagnosis not present

## 2021-08-13 DIAGNOSIS — E782 Mixed hyperlipidemia: Secondary | ICD-10-CM | POA: Diagnosis not present

## 2021-08-13 DIAGNOSIS — E1165 Type 2 diabetes mellitus with hyperglycemia: Secondary | ICD-10-CM | POA: Diagnosis not present

## 2021-08-20 DIAGNOSIS — I10 Essential (primary) hypertension: Secondary | ICD-10-CM | POA: Diagnosis not present

## 2021-08-20 DIAGNOSIS — J301 Allergic rhinitis due to pollen: Secondary | ICD-10-CM | POA: Diagnosis not present

## 2021-08-20 DIAGNOSIS — E782 Mixed hyperlipidemia: Secondary | ICD-10-CM | POA: Diagnosis not present

## 2021-08-20 DIAGNOSIS — E1165 Type 2 diabetes mellitus with hyperglycemia: Secondary | ICD-10-CM | POA: Diagnosis not present

## 2021-09-01 ENCOUNTER — Ambulatory Visit (INDEPENDENT_AMBULATORY_CARE_PROVIDER_SITE_OTHER): Payer: Medicare Other

## 2021-09-01 ENCOUNTER — Ambulatory Visit (HOSPITAL_COMMUNITY)
Admission: EM | Admit: 2021-09-01 | Discharge: 2021-09-01 | Disposition: A | Discharge status: Home / Self Care | Payer: Medicare Other | Attending: Student | Admitting: Student

## 2021-09-01 ENCOUNTER — Encounter (HOSPITAL_COMMUNITY): Payer: Self-pay | Admitting: Emergency Medicine

## 2021-09-01 ENCOUNTER — Other Ambulatory Visit: Payer: Self-pay

## 2021-09-01 DIAGNOSIS — S39012A Strain of muscle, fascia and tendon of lower back, initial encounter: Secondary | ICD-10-CM

## 2021-09-01 DIAGNOSIS — M545 Low back pain, unspecified: Secondary | ICD-10-CM

## 2021-09-01 DIAGNOSIS — Z9889 Other specified postprocedural states: Secondary | ICD-10-CM | POA: Diagnosis not present

## 2021-09-01 MED ORDER — TIZANIDINE HCL 2 MG PO TABS: 2.0000 mg | ORAL_TABLET | Freq: Three times a day (TID) | ORAL | 0 refills | Status: DC | PRN

## 2021-09-01 MED ORDER — METHYLPREDNISOLONE SODIUM SUCC 125 MG IJ SOLR
INTRAMUSCULAR | Status: AC
  Filled 2021-09-01: qty 2

## 2021-09-01 MED ORDER — METHYLPREDNISOLONE SODIUM SUCC 125 MG IJ SOLR
60.0000 mg | Freq: Once | INTRAMUSCULAR | Status: AC
  Administered 2021-09-01: 60 mg via INTRAMUSCULAR

## 2021-09-01 NOTE — ED Provider Notes (Signed)
?[facility] ? ? ? ?CSN: [id number] ?Arrival date & time: 09/01/21  2947 ? ? ?  ? ?History   ?Chief Complaint ?Chief Complaint  ?Patient presents with  ? Back Pain  ? ? ?HPI ?Diamond Brown is a 51 y.o. female presenting with lower back pain for 1 day.  History of herniated disc lumbar spine status post lumbar laminectomy 10/2020.  She is followed by neurosurgery, but states they could not see her today.  States 1 day ago, she was reaching forward into the refrigerator and felt a crunching muscle in her back.  This has continued to hurt with movement and laying flat, but is tolerable with sitting.  She states that she can feel a "lump" on the right side of her back, and some burning pain shooting down the right buttock.  Denies weakness in the arms or legs, denies numbness in the arms or legs, denies urinary incontinence or change in bowel function.  Has attempted ibuprofen at home. ? ?HPI ? ?Past Medical History:  ?Diagnosis Date  ? Agoraphobia   ? Anxiety   ? Brain tumor (benign) ([place])   ? Diabetes mellitus without complication ([place])   ? High cholesterol   ? Hypertension   ? Panic disorder   ? PTSD (post-traumatic stress disorder)   ? Restless leg syndrome   ? ? ?There are no problems to display for this patient. ? ? ?Past Surgical History:  ?Procedure Laterality Date  ? BACK SURGERY    ? PARTIAL HYSTERECTOMY    ? ? ?OB History   ? ? Gravida  ?   ? Para  ?   ? Term  ?   ? Preterm  ?   ? AB  ?   ? Living  ?2  ?  ? ? SAB  ?   ? IAB  ?   ? Ectopic  ?   ? Multiple  ?   ? Live Births  ?   ?   ?  ?  ? ? ? ?Home Medications   ? ?Prior to Admission medications   ?Medication Sig Start Date End Date Taking? Authorizing Provider  ?tiZANidine (ZANAFLEX) 2 MG tablet Take 1 tablet (2 mg total) by mouth every 8 (eight) hours as needed for muscle spasms. 09/01/21  Yes Hazel Sams, PA-C  ?acebutolol (SECTRAL) 200 MG capsule Take 200 mg by mouth every evening.  08/31/19   [provider]  ?Aspirin-Caffeine 845-65  MG PACK Take 1 packet by mouth daily as needed (headache/pain).    [provider]  ?atenolol (TENORMIN) 50 MG tablet Take 50 mg by mouth daily. ?Patient not taking: No sig reported    [provider]  ?bimatoprost (LUMIGAN) 0.03 % ophthalmic solution Place 1 drop into both eyes at bedtime.    [provider]  ?brimonidine-timolol (COMBIGAN) 0.2-0.5 % ophthalmic solution Place 1 drop into both eyes every 12 (twelve) hours.    [provider]  ?carbamazepine (TEGRETOL) 200 MG tablet Take 200 mg by mouth daily as needed (restless legs syndrome).  ?Patient not taking: Reported on 09/01/2021    [provider]  ?Cetirizine HCl (ZYRTEC ALLERGY PO) Take 1 tablet by mouth daily as needed (allergies).    [provider]  ?chlorhexidine (PERIDEX) 0.12 % solution Use as directed 15 mLs in the mouth or throat 2 (two) times daily.  09/25/19   [provider]  ?citalopram (CELEXA) 40 MG tablet Take 40 mg by mouth every evening.  [provider]  ?famotidine (PEPCID) 20 MG tablet Take 1 tablet (20 mg total) by mouth daily. ?Patient taking differently: Take 20 mg by mouth daily as needed for heartburn or indigestion. 11/08/19 12/08/19  Eustaquio Maize, PA-C  ?lisinopril (ZESTRIL) 10 MG tablet Take 10 mg by mouth at bedtime. 10/25/19   [provider]  ?lovastatin (MEVACOR) 40 MG tablet Take 40 mg by mouth at bedtime. 10/25/19   [provider]  ?metFORMIN (GLUCOPHAGE) 1000 MG tablet Take 1,000 mg by mouth 2 (two) times daily with a meal. ?Patient not taking: Reported on 10/11/2020    [provider]  ?metFORMIN (GLUCOPHAGE-XR) 500 MG 24 hr tablet Take 1,000 mg by mouth 2 (two) times daily. 10/25/19   [provider]  ?methocarbamol (ROBAXIN) 500 MG tablet Take 1 tablet (500 mg total) by mouth every 8 (eight) hours as needed for muscle spasms. ?Patient not taking: Reported on 09/01/2021 10/11/20   Orpah Greek, MD   ?ondansetron (ZOFRAN) 4 MG tablet Take 4 mg by mouth 2 (two) times daily as needed for nausea or vomiting.  10/31/19   [provider]  ?oxyCODONE-acetaminophen (PERCOCET) 5-325 MG tablet Take 1-2 tablets by mouth every 4 (four) hours as needed. ?Patient not taking: Reported on 09/01/2021 10/11/20   Orpah Greek, MD  ? ? ?Family History ?Family History  ?Problem Relation Age of Onset  ? Cancer Mother   ? Hypertension Mother   ? Diabetes Mother   ? Glaucoma Father   ? Heart failure Father   ? ? ?Social History ?Social History  ? ?Tobacco Use  ? Smoking status: Some Days  ?  Types: Cigarettes  ? Smokeless tobacco: Never  ?Vaping Use  ? Vaping Use: Never used  ?Substance Use Topics  ? Alcohol use: Yes  ?  Comment: Social  ? Drug use: No  ? ? ? ?Allergies   ?Fanapt [iloperidone] ? ? ?Review of Systems ?Review of Systems  ?Musculoskeletal:  Positive for back pain.  ?All other systems reviewed and are negative. ? ? ?Physical Exam ?Triage Vital Signs ?ED Triage Vitals  ?Enc Vitals Group  ?   BP 09/01/21 0859 139/70  ?   Pulse Rate 09/01/21 0859 85  ?   Resp 09/01/21 0859 18  ?   Temp 09/01/21 0859 97.6 ?F (36.4 ?C)  ?   Temp src --   ?   SpO2 09/01/21 0859 99 %  ?   Weight --   ?   Height --   ?   Head Circumference --   ?   Peak Flow --   ?   Pain Score 09/01/21 0854 8  ?   Pain Loc --   ?   Pain Edu? --   ?   Excl. in [place]? --   ? ?No data found. ? ?Updated Vital Signs ?BP 139/70 (BP Location: Right Arm)   Pulse 85   Temp 97.6 ?F (36.4 ?C)   Resp 18   LMP 07/29/2021   SpO2 99%  ? ?Visual Acuity ?Right Eye Distance:   ?Left Eye Distance:   ?Bilateral Distance:   ? ?Right Eye Near:   ?Left Eye Near:    ?Bilateral Near:    ? ?Physical Exam ?Vitals reviewed.  ?Constitutional:   ?   General: She is not in acute distress. ?   Appearance: Normal appearance. She is not ill-appearing.  ?HENT:  ?   Head: Normocephalic and atraumatic.  ?Cardiovascular:  ?  Rate and Rhythm: Normal rate and regular rhythm.  ?    Heart sounds: Normal heart sounds.  ?Pulmonary:  ?   Effort: Pulmonary effort is normal.  ?   Breath sounds: Normal breath sounds and air entry.  ?Abdominal:  ?   Tenderness: There is no abdominal tenderness. There is no right CVA tenderness, left CVA tenderness, guarding or rebound.  ?Musculoskeletal:  ?   Cervical back: Normal range of motion. No swelling, deformity, signs of trauma, rigidity, spasms, tenderness, bony tenderness or crepitus. No pain with movement.  ?   Thoracic back: No swelling, deformity, signs of trauma, spasms, tenderness or bony tenderness. Normal range of motion. No scoliosis.  ?   Lumbar back: Spasms and tenderness present. No swelling, deformity, signs of trauma or bony tenderness. Normal range of motion. Negative right straight leg raise test and negative left straight leg raise test. No scoliosis.  ?   Comments: Bilateral lumbar paraspinous muscle tenderness to palpation, right worse than left.  There is some hypertonicity of the right lumbar paraspinous muscles.  Pain elicited with both flexion and extension lumbar spine. No midline spinous tenderness, deformity, stepoff.  Positive right straight leg raise.  Gait intact but with pain.  Strength 5/5 lower extremities.  No saddle anesthesia. ? ?Absolutely no other injury, deformity, tenderness, ecchymosis, abrasion.  ?Neurological:  ?   General: No focal deficit present.  ?   Mental Status: She is alert.  ?   Cranial Nerves: No cranial nerve deficit.  ?Psychiatric:     ?   Mood and Affect: Mood normal.     ?   Behavior: Behavior normal.     ?   Thought Content: Thought content normal.     ?   Judgment: Judgment normal.  ? ? ? ?UC Treatments / Results  ?Labs ?(all labs ordered are listed, but only abnormal results are displayed) ?Labs Reviewed - No data to display ? ?EKG ? ? ?Radiology ?DG Lumbar Spine Complete ? ?Result Date: 09/01/2021 ?CLINICAL DATA:  Back pain, previous history of lumbar spine surgery EXAM: LUMBAR SPINE - COMPLETE 4+  VIEW COMPARISON:  Previous MR lumbar spine done on 10/10/2020 and lumbar spine radiographs done on 11/14/2020 FINDINGS: No recent fracture is seen. There is straightening of lordosis. Degenerative changes are noted with d

## 2021-09-01 NOTE — Discharge Instructions (Addendum)
-  Start the muscle relaxer-Zanaflex (tizanidine), up to 3 times daily for muscle spasms and pain.  This can make you drowsy, so take at bedtime or when you do not need to drive or operate machinery. ?-You can take Tylenol up to 1000 mg 3 times daily, and ibuprofen up to 600 mg 3 times daily with food.  You can take these together, or alternate every 3-4 hours. ?-Follow-up with your spine doctor. Call them today to schedule this.  ?-Seek immediate medical attention if new symptoms like weakness, urinary incontinence, etc. ? ? ?

## 2021-09-01 NOTE — ED Triage Notes (Signed)
Has a history of back pain.  Had back surgery in June 2022.  Patient reached into refrigerator and felt a grab in back and has continued to hurt.  Patient felt a pop and extreme pain .  Pain is on right side, pain in lower back and wrapping around right hip.   ?

## 2021-09-09 DIAGNOSIS — Z1231 Encounter for screening mammogram for malignant neoplasm of breast: Secondary | ICD-10-CM | POA: Diagnosis not present

## 2021-09-12 ENCOUNTER — Encounter (HOSPITAL_COMMUNITY): Payer: Self-pay

## 2021-09-12 ENCOUNTER — Encounter (HOSPITAL_COMMUNITY): Payer: Self-pay | Admitting: *Deleted

## 2021-09-12 ENCOUNTER — Ambulatory Visit (HOSPITAL_COMMUNITY)
Admission: EM | Admit: 2021-09-12 | Discharge: 2021-09-12 | Disposition: A | Discharge status: Home / Self Care | Payer: Medicare Other | Attending: Emergency Medicine | Admitting: Emergency Medicine

## 2021-09-12 ENCOUNTER — Emergency Department (HOSPITAL_COMMUNITY): Payer: Medicare Other

## 2021-09-12 ENCOUNTER — Ambulatory Visit (INDEPENDENT_AMBULATORY_CARE_PROVIDER_SITE_OTHER): Payer: Medicare Other

## 2021-09-12 ENCOUNTER — Emergency Department (HOSPITAL_COMMUNITY)
Admission: EM | Admit: 2021-09-12 | Discharge: 2021-09-13 | Disposition: A | Discharge status: Home / Self Care | Payer: Medicare Other | Attending: Emergency Medicine | Admitting: Emergency Medicine

## 2021-09-12 ENCOUNTER — Other Ambulatory Visit: Payer: Self-pay

## 2021-09-12 DIAGNOSIS — T148XXA Other injury of unspecified body region, initial encounter: Secondary | ICD-10-CM

## 2021-09-12 DIAGNOSIS — S7001XA Contusion of right hip, initial encounter: Secondary | ICD-10-CM | POA: Insufficient documentation

## 2021-09-12 DIAGNOSIS — M25551 Pain in right hip: Secondary | ICD-10-CM | POA: Diagnosis not present

## 2021-09-12 DIAGNOSIS — S300XXA Contusion of lower back and pelvis, initial encounter: Secondary | ICD-10-CM | POA: Diagnosis not present

## 2021-09-12 DIAGNOSIS — W19XXXA Unspecified fall, initial encounter: Secondary | ICD-10-CM | POA: Diagnosis not present

## 2021-09-12 DIAGNOSIS — Y92410 Unspecified street and highway as the place of occurrence of the external cause: Secondary | ICD-10-CM | POA: Insufficient documentation

## 2021-09-12 DIAGNOSIS — S79911A Unspecified injury of right hip, initial encounter: Secondary | ICD-10-CM | POA: Diagnosis not present

## 2021-09-12 MED ORDER — IBUPROFEN 800 MG PO TABS
ORAL_TABLET | ORAL | Status: AC
  Filled 2021-09-12: qty 1

## 2021-09-12 MED ORDER — IBUPROFEN 800 MG PO TABS: 800.0000 mg | ORAL_TABLET | Freq: Three times a day (TID) | ORAL | 0 refills | Status: DC

## 2021-09-12 MED ORDER — ACETAMINOPHEN 325 MG PO TABS
ORAL_TABLET | ORAL | Status: AC
  Filled 2021-09-12: qty 3

## 2021-09-12 MED ORDER — IBUPROFEN 800 MG PO TABS
800.0000 mg | ORAL_TABLET | Freq: Once | ORAL | Status: AC
  Administered 2021-09-12: 800 mg via ORAL

## 2021-09-12 MED ORDER — ACETAMINOPHEN 325 MG PO TABS
975.0000 mg | ORAL_TABLET | Freq: Once | ORAL | Status: AC
  Administered 2021-09-12: 975 mg via ORAL

## 2021-09-12 MED ORDER — TRAMADOL HCL 50 MG PO TABS: 50.0000 mg | ORAL_TABLET | Freq: Four times a day (QID) | ORAL | 0 refills | Status: DC | PRN
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
## 2021-09-12 NOTE — Discharge Instructions (Addendum)
The x-ray of the right hip and pelvis showed no evidence of fracture.  It is likely you have a large hematoma deep in the gluteal muscle.  Use ice frequently throughout the day.  I encourage you to follow-up in the emergency room for a stat ultrasound if the area is getting larger and not improving with ice to determine the size of hematoma or if you have new or worsening symptoms for reevaluation and further diagnostics including possible CT scan. ?

## 2021-09-12 NOTE — Discharge Instructions (Addendum)
Like we discussed, please take your prescribed tramadol and ibuprofen.  Please continue to ice the region.  I have given you a work note for Monday if needed.  If you develop any new or worsening symptoms please come back to the emergency department. ?

## 2021-09-13 ENCOUNTER — Telehealth (HOSPITAL_COMMUNITY): Payer: Self-pay | Admitting: Emergency Medicine

## 2021-09-13 ENCOUNTER — Telehealth (HOSPITAL_COMMUNITY): Payer: Self-pay | Admitting: *Deleted

## 2021-09-13 MED ORDER — TRAMADOL HCL 50 MG PO TABS: 50.0000 mg | ORAL_TABLET | Freq: Four times a day (QID) | ORAL | 0 refills | Status: DC | PRN

## 2021-09-13 NOTE — Telephone Encounter (Signed)
Patient returns to urgent care stating that original prescription sent yesterday by another provider at urgent care did not go through.  PDMP reviewed, this prescription for tramadol has not been filled.  Prescription was printed and signed by me exactly as it was written by urgent care provider yesterday. ?

## 2021-09-13 NOTE — Telephone Encounter (Signed)
Pt presented to [facility] questioning Rxs from yesterday; states IBU Rx was sent, but unsure where tramadol Rx was sent, as her stated pharmacy did not have Rx. Rx for tramadol noted to be a "print" status; pt states she never received a printed Rx. ?Discussed with Jane Canary, PA -- Rx provided to pt. ?

## 2021-10-24 ENCOUNTER — Emergency Department: Payer: Medicare Other

## 2021-10-24 ENCOUNTER — Emergency Department
Admission: EM | Admit: 2021-10-24 | Discharge: 2021-10-24 | Disposition: A | Discharge status: Home / Self Care | Payer: Medicare Other | Attending: Emergency Medicine | Admitting: Emergency Medicine

## 2021-10-24 DIAGNOSIS — D329 Benign neoplasm of meninges, unspecified: Secondary | ICD-10-CM | POA: Insufficient documentation

## 2021-10-24 DIAGNOSIS — R531 Weakness: Secondary | ICD-10-CM | POA: Diagnosis not present

## 2021-10-24 DIAGNOSIS — R5383 Other fatigue: Secondary | ICD-10-CM | POA: Insufficient documentation

## 2021-10-24 DIAGNOSIS — I10 Essential (primary) hypertension: Secondary | ICD-10-CM | POA: Insufficient documentation

## 2021-10-24 DIAGNOSIS — D72829 Elevated white blood cell count, unspecified: Secondary | ICD-10-CM | POA: Diagnosis not present

## 2021-10-24 DIAGNOSIS — Z79899 Other long term (current) drug therapy: Secondary | ICD-10-CM | POA: Insufficient documentation

## 2021-10-24 DIAGNOSIS — R42 Dizziness and giddiness: Secondary | ICD-10-CM | POA: Diagnosis not present

## 2021-10-24 DIAGNOSIS — H81399 Other peripheral vertigo, unspecified ear: Secondary | ICD-10-CM | POA: Diagnosis not present

## 2021-10-24 DIAGNOSIS — F419 Anxiety disorder, unspecified: Secondary | ICD-10-CM | POA: Insufficient documentation

## 2021-10-24 DIAGNOSIS — R1110 Vomiting, unspecified: Secondary | ICD-10-CM | POA: Diagnosis not present

## 2021-10-24 DIAGNOSIS — F4310 Post-traumatic stress disorder, unspecified: Secondary | ICD-10-CM | POA: Insufficient documentation

## 2021-10-24 DIAGNOSIS — R6889 Other general symptoms and signs: Secondary | ICD-10-CM | POA: Diagnosis not present

## 2021-10-24 DIAGNOSIS — R002 Palpitations: Secondary | ICD-10-CM | POA: Diagnosis not present

## 2021-10-24 DIAGNOSIS — R404 Transient alteration of awareness: Secondary | ICD-10-CM | POA: Diagnosis not present

## 2021-10-24 DIAGNOSIS — R410 Disorientation, unspecified: Secondary | ICD-10-CM | POA: Diagnosis not present

## 2021-10-24 DIAGNOSIS — R4781 Slurred speech: Secondary | ICD-10-CM | POA: Diagnosis not present

## 2021-10-24 DIAGNOSIS — Z743 Need for continuous supervision: Secondary | ICD-10-CM | POA: Diagnosis not present

## 2021-10-24 LAB — CBC
HCT: 44.7 % (ref 36.0–46.0)
Hemoglobin: 14.8 g/dL (ref 12.0–15.0)
MCH: 31.5 pg (ref 26.0–34.0)
MCHC: 33.1 g/dL (ref 30.0–36.0)
MCV: 95.1 fL (ref 80.0–100.0)
Platelets: 200 10*3/uL (ref 150–400)
RBC: 4.7 MIL/uL (ref 3.87–5.11)
RDW: 12.9 % (ref 11.5–15.5)
WBC: 11.9 10*3/uL — ABNORMAL HIGH (ref 4.0–10.5)
nRBC: 0 % (ref 0.0–0.2)

## 2021-10-24 LAB — DIFFERENTIAL
Abs Immature Granulocytes: 0.04 10*3/uL (ref 0.00–0.07)
Basophils Absolute: 0.1 10*3/uL (ref 0.0–0.1)
Basophils Relative: 1 %
Eosinophils Absolute: 0.5 10*3/uL (ref 0.0–0.5)
Eosinophils Relative: 4 %
Immature Granulocytes: 0 %
Lymphocytes Relative: 24 %
Lymphs Abs: 2.9 10*3/uL (ref 0.7–4.0)
Monocytes Absolute: 0.5 10*3/uL (ref 0.1–1.0)
Monocytes Relative: 4 %
Neutro Abs: 7.8 10*3/uL — ABNORMAL HIGH (ref 1.7–7.7)
Neutrophils Relative %: 67 %

## 2021-10-24 LAB — URINALYSIS, ROUTINE W REFLEX MICROSCOPIC
Bilirubin Urine: NEGATIVE
Glucose, UA: NEGATIVE mg/dL
Hgb urine dipstick: NEGATIVE
Ketones, ur: 5 mg/dL — AB
Leukocytes,Ua: NEGATIVE
Nitrite: NEGATIVE
Protein, ur: NEGATIVE mg/dL
Specific Gravity, Urine: 1.009 (ref 1.005–1.030)
pH: 5 (ref 5.0–8.0)

## 2021-10-24 LAB — COMPREHENSIVE METABOLIC PANEL
ALT: 23 U/L (ref 0–44)
AST: 31 U/L (ref 15–41)
Albumin: 3.9 g/dL (ref 3.5–5.0)
Alkaline Phosphatase: 69 U/L (ref 38–126)
Anion gap: 12 (ref 5–15)
BUN: 12 mg/dL (ref 6–20)
CO2: 19 mmol/L — ABNORMAL LOW (ref 22–32)
Calcium: 8.8 mg/dL — ABNORMAL LOW (ref 8.9–10.3)
Chloride: 105 mmol/L (ref 98–111)
Creatinine, Ser: 0.67 mg/dL (ref 0.44–1.00)
GFR, Estimated: >60 mL/min (ref >60)
Glucose, Bld: 132 mg/dL — ABNORMAL HIGH (ref 70–99)
Potassium: 3.9 mmol/L (ref 3.5–5.1)
Sodium: 136 mmol/L (ref 135–145)
Total Bilirubin: 0.6 mg/dL (ref 0.3–1.2)
Total Protein: 7.2 g/dL (ref 6.5–8.1)

## 2021-10-24 LAB — URINE DRUG SCREEN, QUALITATIVE (ARMC ONLY)
Amphetamines, Ur Screen: NOT DETECTED
Barbiturates, Ur Screen: NOT DETECTED
Benzodiazepine, Ur Scrn: NOT DETECTED
Cannabinoid 50 Ng, Ur ~~LOC~~: NOT DETECTED
Cocaine Metabolite,Ur ~~LOC~~: NOT DETECTED
MDMA (Ecstasy)Ur Screen: NOT DETECTED
Methadone Scn, Ur: NOT DETECTED
Opiate, Ur Screen: NOT DETECTED
Phencyclidine (PCP) Ur S: NOT DETECTED
Tricyclic, Ur Screen: NOT DETECTED

## 2021-10-24 LAB — PROTIME-INR
INR: 0.9 (ref 0.8–1.2)
Prothrombin Time: 11.8 seconds (ref 11.4–15.2)

## 2021-10-24 LAB — CARBAMAZEPINE LEVEL, TOTAL: Carbamazepine Lvl: <2 ug/mL — ABNORMAL LOW (ref 4.0–12.0)

## 2021-10-24 LAB — POC URINE PREG, ED: Preg Test, Ur: NEGATIVE

## 2021-10-24 LAB — APTT: aPTT: 20 seconds — ABNORMAL LOW (ref 24–36)

## 2021-10-24 MED ORDER — GADOBUTROL 1 MMOL/ML IV SOLN
7.0000 mL | Freq: Once | INTRAVENOUS | Status: AC | PRN
  Administered 2021-10-24: 7 mL via INTRAVENOUS

## 2021-10-24 MED ORDER — MECLIZINE HCL 25 MG PO TABS
25.0000 mg | ORAL_TABLET | Freq: Once | ORAL | Status: AC
  Administered 2021-10-24: 25 mg via ORAL
  Filled 2021-10-24: qty 1

## 2021-10-24 MED ORDER — SODIUM CHLORIDE 0.9 % IV BOLUS
1000.0000 mL | Freq: Once | INTRAVENOUS | Status: AC
  Administered 2021-10-24: 1000 mL via INTRAVENOUS

## 2021-10-24 MED ORDER — MECLIZINE HCL 25 MG PO TABS: 25.0000 mg | ORAL_TABLET | Freq: Three times a day (TID) | ORAL | 0 refills | Status: DC | PRN

## 2021-10-24 NOTE — ED Triage Notes (Signed)
Patient to ER with fiance, fiance reports that patient has been having periods of incoherency/ blurred vision/ weakness/ lack of coordination since sunday. LKW Sunday.   Patient reports she has also been very dizzy. States that today she was able to drive to work but when patient was at work EMS were called because she was off balance, unable to answer questions. Patient alert and oriented at this time. Reports having some shortness of breath and feeling "cold inside". Denies CP/ recent illness. No fevers at home.

## 2021-10-24 NOTE — Discharge Instructions (Addendum)
No driving today or while taking meclizine.  Be very cautious, as you may get dizzy.  Do not put yourself in a position where you may become injured if you have an episode of dizziness that were due to be off balance you could become week or fall.  No driving this evening

## 2021-10-24 NOTE — ED Provider Notes (Signed)
[facility] Provider Note    Event Date/Time   First MD Initiated Contact with Patient 10/24/21 1752     (approximate)   History   Weakness   HPI  Diamond Brown is a 51 y.o. female with a history of anxiety, notation of a benign brain tumor (though I am having difficulty finding record of such), hypertension panic disorder PTSD  Patient is here with her fianc.  She reports that since Sunday of the preceding week she has had feeling of dizziness like a spinning or "vertigo feeling" and at times feeling a little bit fatigued or fuzzy headed.  She also at one point seemed like she was having some slight slurring of her speech early in the morning per her fianc  She denies new numbness but feels just fatigued and tired all over.  No fever no headache.  Denies chest pain or trouble breathing.  She reports she has been feeling a little foggy headed anxious and feeling slightly unsteady for about 6 days.  She reports a remote history of diagnosed brain tumor from 10 years ago in the [facility] but reports no further follow-up on that.  Denies known history of multiple sclerosis   No burning with urination.  Denies pregnancy.  No vaginal bleeding or discharge.  No abdominal pain nausea or vomiting.  Physical Exam   Triage Vital Signs: ED Triage Vitals [10/24/21 1653]  Enc Vitals Group     BP (!) 147/88     Pulse Rate 94     Resp 17     Temp 99.6 F (37.6 C)     Temp Source Oral     SpO2 98 %     Weight 160 lb (72.6 kg)     Height '5\' 5"'$  (1.651 m)     Head Circumference      Peak Flow      Pain Score 10     Pain Loc      Pain Edu?      Excl. in [place]?     Most recent vital signs: Vitals:   10/24/21 1950 10/24/21 2107  BP: 110/66 135/78  Pulse: 86 89  Resp: 16 16  Temp:    SpO2: 99% 99%     General: Awake, no distress.  She has clear speech without slurring at this time.  She is fully alert and oriented gives good history.  No noted  confusion CV:  Good peripheral perfusion.  Normal heart sounds well perfused peripherally Resp:  Normal effort.  Clear bilaterally.  No cough.  No increased work of breathing or respiratory distress.  Speaks in full and clear sentences Abd:  No distention.  Soft nontender nondistended throughout Other:  Moves all extremities well to command.  No deficits to sensation face or extremities.  No pronator drift in the extremities.  No ataxia with finger-nose-finger of both hands.  Extraocular movements are normal.  She does report a slight spinning type feeling with head movements.  Overall she has a very reassuring examination without evidence of acute focal or even nonfocal neurologic abnormality at this time.  No evidence of infectious abnormality no rashes.  No headache no meningismus.   ED Results / Procedures / Treatments   Labs (all labs ordered are listed, but only abnormal results are displayed) Labs Reviewed  APTT - Abnormal; Notable for the following components:      Result Value   aPTT 20 (*)    All other  components within normal limits  CBC - Abnormal; Notable for the following components:   WBC 11.9 (*)    All other components within normal limits  DIFFERENTIAL - Abnormal; Notable for the following components:   Neutro Abs 7.8 (*)    All other components within normal limits  COMPREHENSIVE METABOLIC PANEL - Abnormal; Notable for the following components:   CO2 19 (*)    Glucose, Bld 132 (*)    Calcium 8.8 (*)    All other components within normal limits  URINALYSIS, ROUTINE W REFLEX MICROSCOPIC - Abnormal; Notable for the following components:   Color, Urine STRAW (*)    APPearance CLEAR (*)    Ketones, ur 5 (*)    All other components within normal limits  PROTIME-INR  URINE DRUG SCREEN, QUALITATIVE ([facility] ONLY)  CARBAMAZEPINE LEVEL, TOTAL  CBG MONITORING, ED  POC URINE PREG, ED     EKG  Interpreted by me at 1700 Heart rate 99 QRS 89 QTc 480, normal sinus rhythm  slight baseline artifact.  No evidence of acute ischemia   RADIOLOGY  Gross interpretation of CT of the head performed by me, negative for acute gross pathology  Given the patient's constellation of symptoms as well as her feeling of mental fogginess and previous report of some slurred speech in the mornings MRI of the brain ordered to evaluate and exclude etiologies such as tumor mass demyelinating process stroke  CT HEAD WO CONTRAST  Result Date: 10/24/2021 CLINICAL DATA:  Weakness. EXAM: CT HEAD WITHOUT CONTRAST TECHNIQUE: Contiguous axial images were obtained from the base of the skull through the vertex without intravenous contrast. RADIATION DOSE REDUCTION: This exam was performed according to the departmental dose-optimization program which includes automated exposure control, adjustment of the mA and/or kV according to patient size and/or use of iterative reconstruction technique. COMPARISON:  09/26/2017. FINDINGS: Brain: No evidence of acute infarction, hemorrhage, hydrocephalus, extra-axial collection or mass lesion/mass effect. Bony protuberance from the inner table of the superior right frontal bone is stable. Vascular: No hyperdense vessel or unexpected calcification. Skull: No fracture.  No suspicious bone lesion. Sinuses/Orbits: Globes and orbits are unremarkable. Mild left maxillary sinus mucosal thickening. Minor sphenoid, right maxillary sinus and scattered ethmoid sinus mucosal thickening. Other: None. IMPRESSION: 1. No acute intracranial abnormalities. Electronically Signed   By: Lajean Manes M.D.   On: 10/24/2021 17:25   MR Brain W and Wo Contrast  Result Date: 10/24/2021 CLINICAL DATA:  Delirium weakness, fatigue with vertigo like symptoms since Sunday, but questionably some slurred speech as well. Exclude CVA or MS type illness EXAM: MRI HEAD WITHOUT AND WITH CONTRAST TECHNIQUE: Multiplanar, multiecho pulse sequences of the brain and surrounding structures were obtained without  and with intravenous contrast. CONTRAST:  74m GADAVIST GADOBUTROL 1 MMOL/ML IV SOLN COMPARISON:  CT head the same day. FINDINGS: Brain: No acute infarction, acute hemorrhage, hydrocephalus, or extra-axial fluid collection. Approximately 1.7 x 1.6 cm homogeneously enhancing dural-based, extra-axial mass along the high right frontal convexity. No significant mass effect or brain edema Vascular: Major arterial flow voids are maintained. Skull and upper cervical spine: Normal marrow signal. Sinuses/Orbits: Mild-to-moderate paranasal sinus mucosal thickening. Other: No mastoid effusions. IMPRESSION: 1. Approximately 1.7 cm homogeneously enhancing, dural-based, extra-axial mass along the high right frontal convexity, compatible with a meningioma. No significant mass effect or brain edema. 2. Otherwise, no evidence of acute intracranial abnormality Electronically Signed   By: FMargaretha SheffieldM.D.   On: 10/24/2021 20:07    MRI of the  brain denotes one-point centimeter mass compatible with meningioma.  No mass effect or edema.   PROCEDURES:  Critical Care performed: No  Procedures   MEDICATIONS ORDERED IN ED: Medications  meclizine (ANTIVERT) tablet 25 mg (has no administration in time range)  gadobutrol (GADAVIST) 1 MMOL/ML injection 7 mL (7 mLs Intravenous Contrast Given 10/24/21 1943)  sodium chloride 0.9 % bolus 1,000 mL (1,000 mLs Intravenous New Bag/Given 10/24/21 2009)     IMPRESSION / MDM / ASSESSMENT AND PLAN / ED COURSE  I reviewed the triage vital signs and the nursing notes.                              Differential diagnosis includes, but is not limited to, possible vertigo likely of a peripheral nature.  Wish to exclude central process though given reports of some slurred speech feeling of mental confusion at times fatigue generalized weakness.  No clinical symptoms that would suggest that symptomatology would be of a cervical nature, will obtain MRI of the brain also given she reports  a remote history of benign brain tumor.  CT imaging unremarkable  Her lab work urinalysis and physical exam at this time very reassuring.  Does not appear to have any acute neurologic abnormality or concern at this time other than vertigo-like sensation with head movement, but hard to align with a clear picture of peripheral vertigo given reports of slurred speech and fatigue mental fogginess.  Also does have a history of underlying psychiatric disease, question if this could be contributory.  Will provide broad medical work-up   Labs interpreted by me as slightly reduced CO2, very mild leukocytosis and negative pregnancy test normal urinalysis  Constellation of symptoms could represent possible viral-like etiology certainly no evidence of meningismus encephalitis meningitis or obvious acute bacterial illness.  No pulmonary symptoms  patient's presentation is most consistent with acute presentation with potential threat to life or bodily function.  The patient is on the cardiac monitor to evaluate for evidence of arrhythmia and/or significant heart rate changes.    Review of patient medications, I do not see any medications which require leveling aside from carbamazepine which I believe is a send out  Vitals:   10/24/21 1950 10/24/21 2107  BP: 110/66 135/78  Pulse: 86 89  Resp: 16 16  Temp:    SpO2: 99% 99%     Patient resting comfortably.  Reviewed with her and her fianc her lab results.  She appears well, fully alert well oriented.  She received IV fluids, and also meclizine for what I suspect is peripheral vertigo some nausea possibly some dehydration.  At this point there is no evidence of acute central neurologic process or obvious systemic disease acute cardiac abnormality etc.  Discussed with patient careful return precautions, treatment recommendations and follow-up.  Also noted on MRI is a meningioma and she reports that she has had a history of said for 10 years.  I do not think  that she is symptomatic secondary to meningioma, but did recommend she establish follow-up with neurosurgery for ongoing observation and follow-ups regarding this finding as well.  She is agreeable with this plan.  Her fianc will be driving her home.  Return precautions and treatment recommendations and follow-up discussed with the patient who is agreeable with the plan.   FINAL CLINICAL IMPRESSION(S) / ED DIAGNOSES   Final diagnoses:  Peripheral vertigo, unspecified laterality  Other fatigue  Meningioma ([place])  Rx / DC Orders   ED Discharge Orders          Ordered    meclizine (ANTIVERT) 25 MG tablet  3 times daily PRN        10/24/21 2131             Note:  This document was prepared using Dragon voice recognition software and may include unintentional dictation errors.   Delman Kitten, MD 10/24/21 2136

## 2021-10-24 NOTE — ED Notes (Signed)
Per pt "Still not feeling quite right" "Feel like an alien" Endorses pain in head "not a headache but it's..ibuprofen don't know"

## 2021-10-24 NOTE — ED Triage Notes (Signed)
Pt via EMS from home. Pt c/o generalized weakness, "head foggy", anxiety, and unsteadiness on her feet for the past week. Pt has  hx of stroke and brain tumor that has not been checked in 6 years. Pt is A&Ox4 and NAD  178/93 136 CBG 99% on RA 98 HR  20G L AC

## 2021-10-24 NOTE — ED Notes (Signed)
Assisted pt to toilet 

## 2021-10-28 DIAGNOSIS — R42 Dizziness and giddiness: Secondary | ICD-10-CM | POA: Diagnosis not present

## 2021-10-28 DIAGNOSIS — D329 Benign neoplasm of meninges, unspecified: Secondary | ICD-10-CM | POA: Diagnosis not present

## 2021-10-28 DIAGNOSIS — R471 Dysarthria and anarthria: Secondary | ICD-10-CM | POA: Diagnosis not present

## 2021-10-28 DIAGNOSIS — R410 Disorientation, unspecified: Secondary | ICD-10-CM | POA: Diagnosis not present

## 2021-11-13 DIAGNOSIS — R42 Dizziness and giddiness: Secondary | ICD-10-CM | POA: Diagnosis not present

## 2021-11-13 DIAGNOSIS — D329 Benign neoplasm of meninges, unspecified: Secondary | ICD-10-CM | POA: Diagnosis not present

## 2021-11-13 DIAGNOSIS — R471 Dysarthria and anarthria: Secondary | ICD-10-CM | POA: Diagnosis not present

## 2021-11-13 DIAGNOSIS — R410 Disorientation, unspecified: Secondary | ICD-10-CM | POA: Diagnosis not present

## 2021-11-19 DIAGNOSIS — D320 Benign neoplasm of cerebral meninges: Secondary | ICD-10-CM | POA: Diagnosis not present

## 2021-11-26 DIAGNOSIS — Z0000 Encounter for general adult medical examination without abnormal findings: Secondary | ICD-10-CM | POA: Diagnosis not present

## 2021-11-26 DIAGNOSIS — E1165 Type 2 diabetes mellitus with hyperglycemia: Secondary | ICD-10-CM | POA: Diagnosis not present

## 2021-11-26 DIAGNOSIS — I10 Essential (primary) hypertension: Secondary | ICD-10-CM | POA: Diagnosis not present

## 2021-12-03 DIAGNOSIS — R202 Paresthesia of skin: Secondary | ICD-10-CM | POA: Diagnosis not present

## 2021-12-03 DIAGNOSIS — Z0000 Encounter for general adult medical examination without abnormal findings: Secondary | ICD-10-CM | POA: Diagnosis not present

## 2021-12-03 DIAGNOSIS — R471 Dysarthria and anarthria: Secondary | ICD-10-CM | POA: Diagnosis not present

## 2021-12-03 DIAGNOSIS — D329 Benign neoplasm of meninges, unspecified: Secondary | ICD-10-CM | POA: Diagnosis not present

## 2021-12-03 DIAGNOSIS — E1165 Type 2 diabetes mellitus with hyperglycemia: Secondary | ICD-10-CM | POA: Diagnosis not present

## 2021-12-08 ENCOUNTER — Encounter: Payer: Self-pay | Admitting: *Deleted

## 2021-12-10 ENCOUNTER — Encounter: Payer: Self-pay | Admitting: Diagnostic Neuroimaging

## 2021-12-10 ENCOUNTER — Ambulatory Visit (INDEPENDENT_AMBULATORY_CARE_PROVIDER_SITE_OTHER): Payer: Medicare Other | Admitting: Diagnostic Neuroimaging

## 2021-12-10 VITALS — BP 138/82 | HR 87 | Ht 65.0 in | Wt 166.0 lb

## 2021-12-10 DIAGNOSIS — R410 Disorientation, unspecified: Secondary | ICD-10-CM | POA: Diagnosis not present
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
## 2021-12-11 LAB — VITAMIN B12: Vitamin B-12: 260 pg/mL (ref 232–1245)

## 2021-12-11 LAB — TSH: TSH: 1.17 u[IU]/mL (ref 0.450–4.500)

## 2022-01-01 DIAGNOSIS — E119 Type 2 diabetes mellitus without complications: Secondary | ICD-10-CM | POA: Diagnosis not present

## 2022-01-01 DIAGNOSIS — H2513 Age-related nuclear cataract, bilateral: Secondary | ICD-10-CM | POA: Diagnosis not present

## 2022-01-01 DIAGNOSIS — I10 Essential (primary) hypertension: Secondary | ICD-10-CM | POA: Diagnosis not present

## 2022-01-01 DIAGNOSIS — H401132 Primary open-angle glaucoma, bilateral, moderate stage: Secondary | ICD-10-CM | POA: Diagnosis not present

## 2022-03-11 DIAGNOSIS — E1165 Type 2 diabetes mellitus with hyperglycemia: Secondary | ICD-10-CM | POA: Diagnosis not present

## 2022-03-11 DIAGNOSIS — E782 Mixed hyperlipidemia: Secondary | ICD-10-CM | POA: Diagnosis not present

## 2022-03-14 ENCOUNTER — Encounter (HOSPITAL_COMMUNITY): Payer: Self-pay

## 2022-03-14 ENCOUNTER — Ambulatory Visit (HOSPITAL_COMMUNITY)
Admission: EM | Admit: 2022-03-14 | Discharge: 2022-03-14 | Disposition: A | Discharge status: Home / Self Care | Payer: Medicare Other | Attending: Physician Assistant | Admitting: Physician Assistant

## 2022-03-14 DIAGNOSIS — J0100 Acute maxillary sinusitis, unspecified: Secondary | ICD-10-CM | POA: Diagnosis not present

## 2022-03-14 MED ORDER — FLUCONAZOLE 150 MG PO TABS: 150.0000 mg | ORAL_TABLET | Freq: Once | ORAL | 0 refills | Status: AC

## 2022-03-14 MED ORDER — AMOXICILLIN-POT CLAVULANATE 875-125 MG PO TABS: 1.0000 | ORAL_TABLET | Freq: Two times a day (BID) | ORAL | 0 refills | Status: DC

## 2022-03-14 NOTE — ED Provider Notes (Signed)
Liborio Negron Torres    CSN: [id number] Arrival date & time: 03/14/22  1157      History   Chief Complaint Chief Complaint  Patient presents with   Sinus Problem   Nasal Congestion    HPI Diamond Brown is a 51 y.o. female.   Patient here today for evaluation of sinus congestion and pressure that started a week ago. She has had sinus infections in the past and feels her current symptoms are similar. She reports some diffuse dental pain. She has not had any fever. She does report worsening symptoms in the afternoon and night. She has tried OTC meds without resolution.   The history is provided by the patient.  Sinus Problem Pertinent negatives include no abdominal pain and no shortness of breath.    Past Medical History:  Diagnosis Date   Agoraphobia    Anxiety    Brain tumor (benign) ([facility])    Diabetes mellitus without complication (HCC)    Dysarthria    High cholesterol    Hypertension    Meningioma (HCC)    Panic disorder    PTSD (post-traumatic stress disorder)    Restless leg syndrome    Vertigo     There are no problems to display for this patient.   Past Surgical History:  Procedure Laterality Date   BACK SURGERY     PARTIAL HYSTERECTOMY      OB History     Gravida      Para      Term      Preterm      AB      Living  2      SAB      IAB      Ectopic      Multiple      Live Births               Home Medications    Prior to Admission medications   Medication Sig Start Date End Date Taking? Authorizing Provider  acebutolol (SECTRAL) 200 MG capsule Take 200 mg by mouth every evening.  08/31/19  Yes [provider]  amoxicillin-clavulanate (AUGMENTIN) 875-125 MG tablet Take 1 tablet by mouth every 12 (twelve) hours. 03/14/22  Yes Francene Finders, PA-C  ARIPiprazole (ABILIFY) 2 MG tablet Take 1 mg by mouth daily.   Yes [provider]  atenolol (TENORMIN) 50 MG tablet Take 50 mg by mouth daily.   Yes  [provider]  bimatoprost (LUMIGAN) 0.03 % ophthalmic solution Place 1 drop into both eyes at bedtime.   Yes [provider]  brimonidine-timolol (COMBIGAN) 0.2-0.5 % ophthalmic solution Place 1 drop into both eyes every 12 (twelve) hours.   Yes [provider]  Cetirizine HCl (ZYRTEC ALLERGY PO) Take 1 tablet by mouth daily as needed (allergies).   Yes [provider]  chlorhexidine (PERIDEX) 0.12 % solution Use as directed 15 mLs in the mouth or throat 2 (two) times daily.  09/25/19  Yes [provider]  fluconazole (DIFLUCAN) 150 MG tablet Take 1 tablet (150 mg total) by mouth once for 1 dose. 03/14/22 03/14/22 Yes Francene Finders, PA-C  ibuprofen (ADVIL) 800 MG tablet Take 1 tablet (800 mg total) by mouth 3 (three) times daily. 09/12/21  Yes Hezzie Bump, NP  lisinopril (ZESTRIL) 10 MG tablet Take 10 mg by mouth at bedtime. 10/25/19  Yes [provider]  lovastatin (MEVACOR) 40 MG tablet Take 40 mg by mouth at bedtime.  10/25/19  Yes [provider]  meclizine (ANTIVERT) 25 MG tablet Take 1 tablet (25 mg total) by mouth 3 (three) times daily as needed for dizziness. 10/24/21  Yes Delman Kitten, MD  metFORMIN (GLUCOPHAGE-XR) 500 MG 24 hr tablet Take 1,000 mg by mouth 2 (two) times daily. 10/25/19  Yes [provider]  ondansetron (ZOFRAN) 4 MG tablet Take 4 mg by mouth 2 (two) times daily as needed for nausea or vomiting.  10/31/19  Yes [provider]  famotidine (PEPCID) 20 MG tablet Take 1 tablet (20 mg total) by mouth daily. Patient taking differently: Take 20 mg by mouth daily as needed for heartburn or indigestion. 11/08/19 12/08/19  Eustaquio Maize, PA-C    Family History Family History  Problem Relation Age of Onset   Cancer Mother    Hypertension Mother    Diabetes Mother    Glaucoma Father    Heart failure Father    Diabetes Sister    Diabetes Brother     Social History Social History   Tobacco Use    Smoking status: Some Days    Types: Cigarettes   Smokeless tobacco: Never  Vaping Use   Vaping Use: Never used  Substance Use Topics   Alcohol use: Yes    Comment: Social   Drug use: No     Allergies   Fanapt [iloperidone]   Review of Systems Review of Systems  Constitutional:  Negative for chills and fever.  HENT:  Positive for congestion, sinus pressure and sore throat. Negative for ear pain.   Eyes:  Negative for discharge and redness.  Respiratory:  Positive for cough. Negative for shortness of breath and wheezing.   Gastrointestinal:  Negative for abdominal pain, diarrhea, nausea and vomiting.     Physical Exam Triage Vital Signs ED Triage Vitals  Enc Vitals Group     BP 03/14/22 1306 (!) 146/79     Pulse Rate 03/14/22 1306 89     Resp 03/14/22 1306 16     Temp 03/14/22 1306 98.4 F (36.9 C)     Temp Source 03/14/22 1306 Oral     SpO2 03/14/22 1306 98 %     Weight --      Height --      Head Circumference --      Peak Flow --      Pain Score 03/14/22 1305 5     Pain Loc --      Pain Edu? --      Excl. in [place]? --    No data found.  Updated Vital Signs BP (!) 146/79 (BP Location: Right Arm)   Pulse 89   Temp 98.4 F (36.9 C) (Oral)   Resp 16   SpO2 98%   Physical Exam Vitals and nursing note reviewed.  Constitutional:      General: She is not in acute distress.    Appearance: Normal appearance. She is not ill-appearing.  HENT:     Head: Normocephalic and atraumatic.     Nose: Congestion present.     Mouth/Throat:     Mouth: Mucous membranes are moist.     Pharynx: No oropharyngeal exudate or posterior oropharyngeal erythema.  Eyes:     Conjunctiva/sclera: Conjunctivae normal.  Cardiovascular:     Rate and Rhythm: Normal rate and regular rhythm.     Heart sounds: Normal heart sounds. No murmur heard. Pulmonary:     Effort: Pulmonary effort is normal. No respiratory distress.     Breath  sounds: Normal breath sounds. No wheezing, rhonchi or  rales.  Skin:    General: Skin is warm and dry.  Neurological:     Mental Status: She is alert.  Psychiatric:        Mood and Affect: Mood normal.        Thought Content: Thought content normal.      UC Treatments / Results  Labs (all labs ordered are listed, but only abnormal results are displayed) Labs Reviewed - No data to display  EKG   Radiology No results found.  Procedures Procedures (including critical care time)  Medications Ordered in UC Medications - No data to display  Initial Impression / Assessment and Plan / UC Course  I have reviewed the triage vital signs and the nursing notes.  Pertinent labs & imaging results that were available during my care of the patient were reviewed by me and considered in my medical decision making (see chart for details).    Suspect likely maxillary sinusitis.  Will treat with Augmentin as well as Diflucan at patient's request to cover yeast vaginitis that typically accompanies antibiotic therapy.  Encouraged follow-up with any further concerns or lack of improvement with treatment.  Final Clinical Impressions(s) / UC Diagnoses   Final diagnoses:  Acute maxillary sinusitis, recurrence not specified   Discharge Instructions   None    ED Prescriptions     Medication Sig Dispense Auth. Provider   amoxicillin-clavulanate (AUGMENTIN) 875-125 MG tablet Take 1 tablet by mouth every 12 (twelve) hours. 14 tablet Ewell Poe F, PA-C   fluconazole (DIFLUCAN) 150 MG tablet Take 1 tablet (150 mg total) by mouth once for 1 dose. 1 tablet Francene Finders, PA-C      PDMP not reviewed this encounter.   Francene Finders, PA-C 03/14/22 1435

## 2022-03-14 NOTE — ED Triage Notes (Signed)
Patient having sinus symptoms onset a week ago. Patient having facial pain, gum soreness, eye swelling with drainage, post nasal drip, tonsil swelling, green/yellow production with blood when coughing and blowing her nose.   No known sick exposure but Patient does work in [employer]. Patient does have a history of sinus infections.

## 2022-03-18 DIAGNOSIS — Z23 Encounter for immunization: Secondary | ICD-10-CM | POA: Diagnosis not present

## 2022-03-18 DIAGNOSIS — E1165 Type 2 diabetes mellitus with hyperglycemia: Secondary | ICD-10-CM | POA: Diagnosis not present

## 2022-03-18 DIAGNOSIS — J301 Allergic rhinitis due to pollen: Secondary | ICD-10-CM | POA: Diagnosis not present

## 2022-03-18 DIAGNOSIS — E782 Mixed hyperlipidemia: Secondary | ICD-10-CM | POA: Diagnosis not present

## 2022-03-18 DIAGNOSIS — I10 Essential (primary) hypertension: Secondary | ICD-10-CM | POA: Diagnosis not present

## 2022-08-12 DIAGNOSIS — I10 Essential (primary) hypertension: Secondary | ICD-10-CM | POA: Diagnosis not present

## 2022-08-12 DIAGNOSIS — E782 Mixed hyperlipidemia: Secondary | ICD-10-CM | POA: Diagnosis not present

## 2022-08-12 DIAGNOSIS — E1165 Type 2 diabetes mellitus with hyperglycemia: Secondary | ICD-10-CM | POA: Diagnosis not present

## 2022-08-19 DIAGNOSIS — D329 Benign neoplasm of meninges, unspecified: Secondary | ICD-10-CM | POA: Diagnosis not present

## 2022-08-19 DIAGNOSIS — G43909 Migraine, unspecified, not intractable, without status migrainosus: Secondary | ICD-10-CM | POA: Diagnosis not present

## 2022-08-19 DIAGNOSIS — E782 Mixed hyperlipidemia: Secondary | ICD-10-CM | POA: Diagnosis not present

## 2022-08-19 DIAGNOSIS — N182 Chronic kidney disease, stage 2 (mild): Secondary | ICD-10-CM | POA: Diagnosis not present

## 2022-08-19 DIAGNOSIS — I10 Essential (primary) hypertension: Secondary | ICD-10-CM | POA: Diagnosis not present

## 2022-08-19 DIAGNOSIS — E1165 Type 2 diabetes mellitus with hyperglycemia: Secondary | ICD-10-CM | POA: Diagnosis not present

## 2022-09-23 DIAGNOSIS — Z1231 Encounter for screening mammogram for malignant neoplasm of breast: Secondary | ICD-10-CM | POA: Diagnosis not present

## 2022-10-18 ENCOUNTER — Ambulatory Visit (INDEPENDENT_AMBULATORY_CARE_PROVIDER_SITE_OTHER): Payer: 59

## 2022-10-18 ENCOUNTER — Ambulatory Visit (HOSPITAL_COMMUNITY)
Admission: EM | Admit: 2022-10-18 | Discharge: 2022-10-18 | Disposition: A | Discharge status: Home / Self Care | Payer: 59 | Attending: Family Medicine | Admitting: Family Medicine

## 2022-10-18 ENCOUNTER — Other Ambulatory Visit: Payer: Self-pay

## 2022-10-18 ENCOUNTER — Encounter (HOSPITAL_COMMUNITY): Payer: Self-pay | Admitting: *Deleted

## 2022-10-18 DIAGNOSIS — R053 Chronic cough: Secondary | ICD-10-CM

## 2022-10-18 DIAGNOSIS — R059 Cough, unspecified: Secondary | ICD-10-CM | POA: Diagnosis not present

## 2022-10-18 MED ORDER — IPRATROPIUM-ALBUTEROL 0.5-2.5 (3) MG/3ML IN SOLN
RESPIRATORY_TRACT | Status: AC
  Filled 2022-10-18: qty 3

## 2022-10-18 MED ORDER — PREDNISONE 20 MG PO TABS: 40.0000 mg | ORAL_TABLET | Freq: Every day | ORAL | 0 refills | Status: DC

## 2022-10-18 MED ORDER — HYDROCODONE BIT-HOMATROP MBR 5-1.5 MG/5ML PO SOLN: 5.0000 mL | Freq: Four times a day (QID) | ORAL | 0 refills | Status: DC | PRN

## 2022-10-18 MED ORDER — AZITHROMYCIN 250 MG PO TABS: 250.0000 mg | ORAL_TABLET | Freq: Every day | ORAL | 0 refills | Status: DC

## 2022-10-18 MED ORDER — IPRATROPIUM-ALBUTEROL 0.5-2.5 (3) MG/3ML IN SOLN
3.0000 mL | Freq: Once | RESPIRATORY_TRACT | Status: AC
  Administered 2022-10-18: 3 mL via RESPIRATORY_TRACT

## 2022-10-18 MED ORDER — FLUCONAZOLE 150 MG PO TABS: ORAL_TABLET | ORAL | 0 refills | Status: DC

## 2022-10-18 NOTE — ED Provider Notes (Signed)
[facility]   [id number] 10/18/22 Arrival Time: 0845  ASSESSMENT & PLAN:  1. Persistent cough for 3 weeks or longer    Feeling improved after DuoNeb treatment. Better air movement; is more comfortable. Meds ordered this encounter  Medications   ipratropium-albuterol (DUONEB) 0.5-2.5 (3) MG/3ML nebulizer solution 3 mL   I have personally viewed and independently interpreted the imaging studies ordered this visit. No acute changes on CXR. No infiltrates. No pneumothorax.   OTC symptom care as needed. Given duration of symptoms will cover for atypical.  Meds ordered this encounter  Medications   predniSONE (DELTASONE) 20 MG tablet    Sig: Take 2 tablets (40 mg total) by mouth daily.    Dispense:  10 tablet    Refill:  0   azithromycin (ZITHROMAX) 250 MG tablet    Sig: Take 1 tablet (250 mg total) by mouth daily. Take first 2 tablets together, then 1 every day until finished.    Dispense:  6 tablet    Refill:  0   HYDROcodone bit-homatropine (HYCODAN) 5-1.5 MG/5ML syrup    Sig: Take 5 mLs by mouth every 6 (six) hours as needed for cough.    Dispense:  90 mL    Refill:  0   fluconazole (DIFLUCAN) 150 MG tablet    Sig: Take one tablet by mouth as a single dose. May repeat in 3 days if symptoms persist.    Dispense:  2 tablet    Refill:  0  Fluconazole Rx at pt request; h/o yeast infections on antibiotics.  May f/u here or with PCP if not improving; ED if abrupt worsening; voices understanding.  Reviewed expectations re: course of current medical issues. Questions answered. Outlined signs and symptoms indicating need for more acute intervention. Patient verbalized understanding. After Visit Summary given.  SUBJECTIVE: History from: patient.  Diamond Brown is a 52 y.o. female who presents with complaint of persistent coughing; x 3 weeks; productive at times; does feel SOB (feels wheezing/constriction). Denies assoc CP/n/v. Cough is affecting sleep. Denies  fever but does get chilled at times. Normal ambulation. Feels fatigued. No h/o asthma.  Social History   Tobacco Use  Smoking Status Some Days   Types: Cigarettes  Smokeless Tobacco Never    OBJECTIVE:  Vitals:   10/18/22 1000  BP: (!) 149/81  Pulse: 94  Resp: 20  Temp: 97.7 F (36.5 C)  SpO2: 97%    General appearance: alert; NAD HEENT: [place]; AT; with mild nasal congestion Neck: supple without LAD CV: RRR without murmer Lungs: unlabored respirations, moderate bilateral expiratory wheezing; cough: significant; no respiratory distress Skin: warm and dry Psychological: alert and cooperative; normal mood and affect  Imaging: DG Chest 2 View  Result Date: 10/18/2022 CLINICAL DATA:  persistent coughing > 3 weeks EXAM: CHEST - 2 VIEW COMPARISON:  Chest x-ray 03/15/2019, CT chest 06/19/2003 FINDINGS: The heart and mediastinal contours are within normal limits. No focal consolidation. No pulmonary edema. No pleural effusion. No pneumothorax. No acute osseous abnormality. IMPRESSION: No active cardiopulmonary disease. Electronically Signed   By: Tish Frederickson M.D.   On: 10/18/2022 10:55    Allergies  Allergen Reactions   Fanapt [Iloperidone] Swelling    Facial Swelling    Past Medical History:  Diagnosis Date   Agoraphobia    Anxiety    Brain tumor (benign) (HCC)    Diabetes mellitus without complication (HCC)    Dysarthria    High cholesterol    Hypertension  Meningioma (HCC)    Panic disorder    PTSD (post-traumatic stress disorder)    Restless leg syndrome    Vertigo    Family History  Problem Relation Age of Onset   Cancer Mother    Hypertension Mother    Diabetes Mother    Glaucoma Father    Heart failure Father    Diabetes Sister    Diabetes Brother    Social History   Socioeconomic History   Marital status: Divorced    Spouse name: Not on file   Number of children: 2   Years of education: Not on file   Highest education level: Not on file   Occupational History   Not on file  Tobacco Use   Smoking status: Some Days    Types: Cigarettes   Smokeless tobacco: Never  Vaping Use   Vaping Use: Never used  Substance and Sexual Activity   Alcohol use: Yes    Comment: Social   Drug use: No   Sexual activity: Yes  Other Topics Concern   Not on file  Social History Narrative   Lives with children   Caffeine- 1-2 a day   Social Determinants of Health   Financial Resource Strain: Not on file  Food Insecurity: Not on file  Transportation Needs: Not on file  Physical Activity: Not on file  Stress: Not on file  Social Connections: Not on file  Intimate Partner Violence: Not on file             [place], MD 10/18/22 1311

## 2022-10-18 NOTE — Discharge Instructions (Addendum)
Be aware, your cough medication may cause drowsiness. Please do not drive, operate heavy machinery or make important decisions while on this medication, it can cloud your judgement.  Be aware, your blood sugars will run higher than normal while taking prednisone.

## 2022-10-18 NOTE — ED Triage Notes (Signed)
Pt reports her Sx's started 3 weeks ago . Pt continues to have a productive cough ,Ha and wheezing. Pt reports cough is worse when talking.

## 2022-11-16 ENCOUNTER — Other Ambulatory Visit (HOSPITAL_COMMUNITY): Payer: Self-pay | Admitting: Neurological Surgery

## 2022-11-16 DIAGNOSIS — D320 Benign neoplasm of cerebral meninges: Secondary | ICD-10-CM

## 2022-11-23 ENCOUNTER — Ambulatory Visit (HOSPITAL_COMMUNITY)
Admission: RE | Admit: 2022-11-23 | Discharge: 2022-11-23 | Disposition: A | Discharge status: Home / Self Care | Payer: 59 | Source: Ambulatory Visit | Attending: Neurological Surgery | Admitting: Neurological Surgery

## 2022-11-23 DIAGNOSIS — D320 Benign neoplasm of cerebral meninges: Secondary | ICD-10-CM | POA: Diagnosis not present

## 2022-11-23 MED ORDER — GADOBUTROL 1 MMOL/ML IV SOLN
7.5000 mL | Freq: Once | INTRAVENOUS | Status: AC | PRN
  Administered 2022-11-23: 7.5 mL via INTRAVENOUS

## 2022-12-16 DIAGNOSIS — D320 Benign neoplasm of cerebral meninges: Secondary | ICD-10-CM | POA: Diagnosis not present

## 2022-12-23 DIAGNOSIS — I10 Essential (primary) hypertension: Secondary | ICD-10-CM | POA: Diagnosis not present

## 2022-12-23 DIAGNOSIS — R002 Palpitations: Secondary | ICD-10-CM | POA: Diagnosis not present

## 2022-12-23 DIAGNOSIS — E782 Mixed hyperlipidemia: Secondary | ICD-10-CM | POA: Diagnosis not present

## 2022-12-23 DIAGNOSIS — E1165 Type 2 diabetes mellitus with hyperglycemia: Secondary | ICD-10-CM | POA: Diagnosis not present

## 2022-12-23 DIAGNOSIS — Z0000 Encounter for general adult medical examination without abnormal findings: Secondary | ICD-10-CM | POA: Diagnosis not present

## 2022-12-27 IMAGING — CT CT HIP*R* W/O CM
2 of 3 series · 17 of 46 positions shown, 19 images · non-contrast
Comparison: 09/12/2021

CLINICAL DATA: Hip trauma, fracture suspected.

EXAM:
CT OF THE RIGHT HIP WITHOUT CONTRAST
TECHNIQUE: Multidetector CT imaging of the right hip was performed according to
the standard protocol. Multiplanar CT image reconstructions were
also generated.
RADIATION DOSE REDUCTION: This exam was performed according to the
departmental dose-optimization program which includes automated
exposure control, adjustment of the mA and/or kV according to
patient size and/or use of iterative reconstruction technique.

[Series 5: soft tissue · axial · 0.55mm/px · z∈[-365,-111]mm · 14 of 147 slices shown, 16 images]
[im 10/147  soft-tissue]
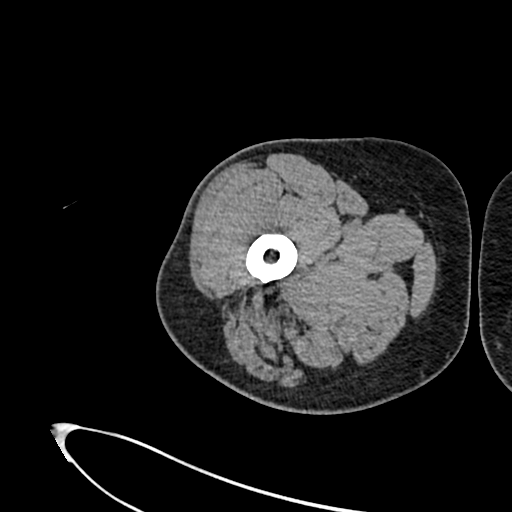
[im 10/147  bone]
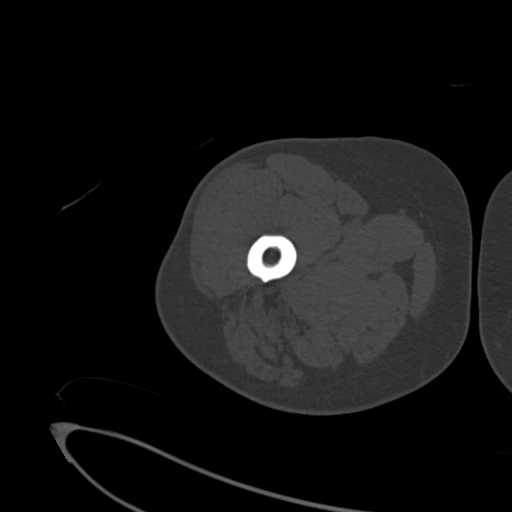
[im 19/147  soft-tissue]
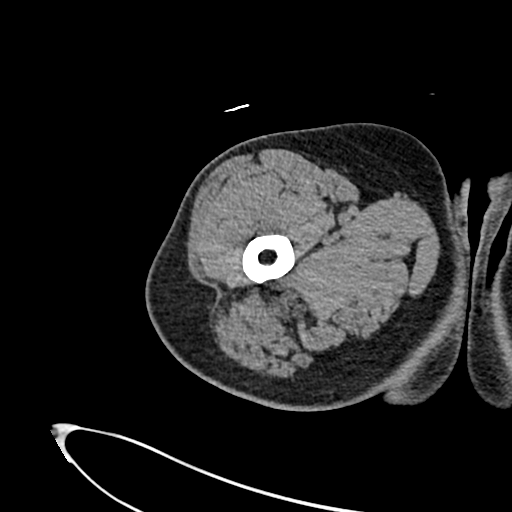
[im 29/147  soft-tissue]
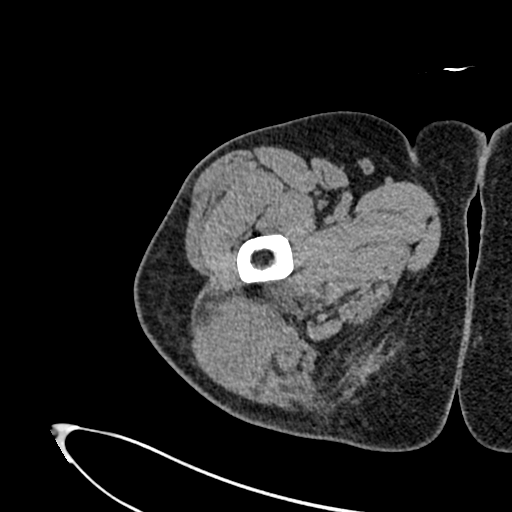
[im 38/147  soft-tissue]
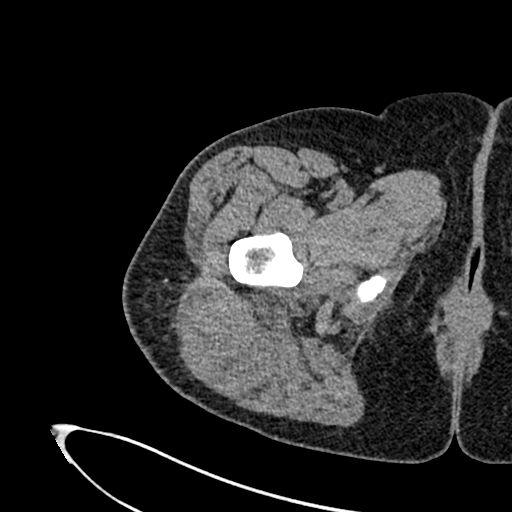
[im 48/147  soft-tissue]
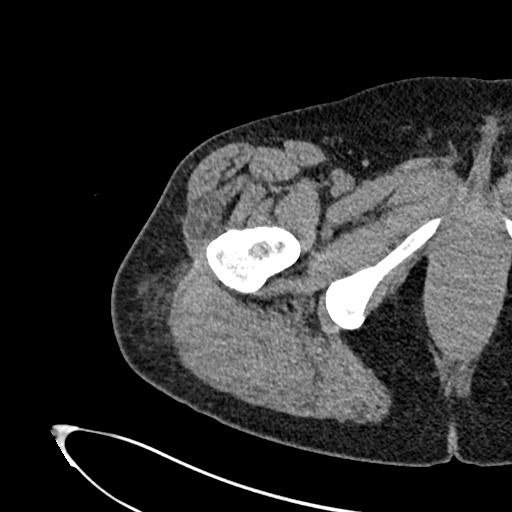
[im 57/147  soft-tissue]
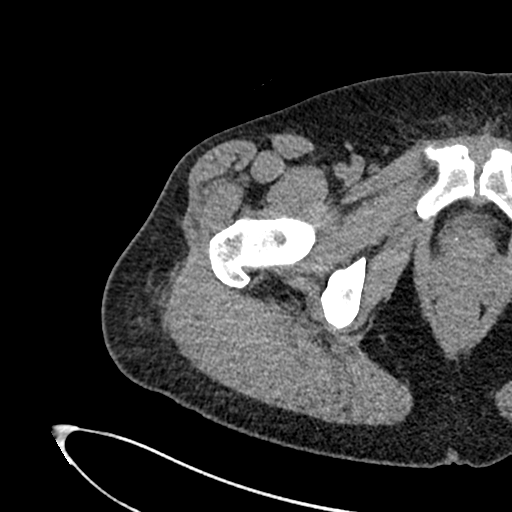
[im 66/147  soft-tissue]
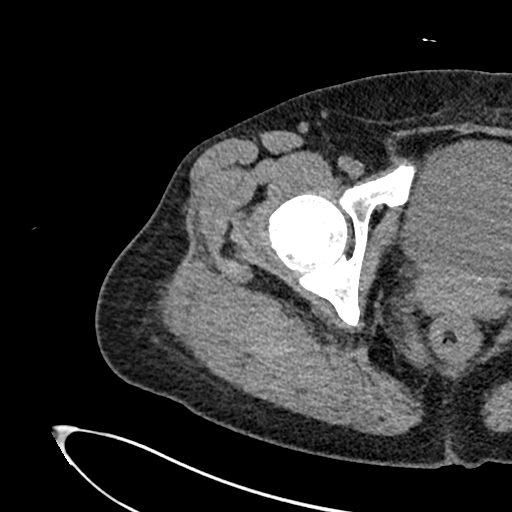
[im 81/147  soft-tissue]
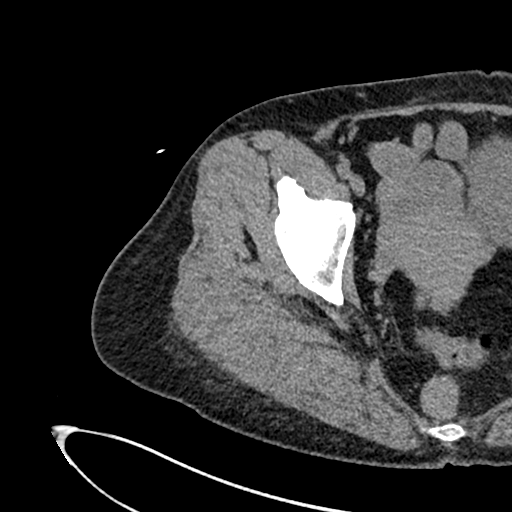
[im 90/147  soft-tissue]
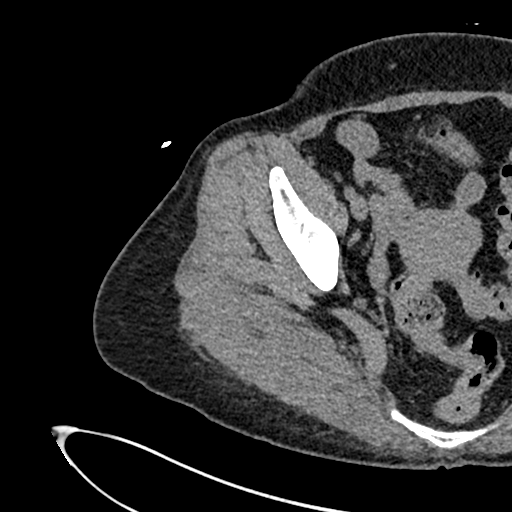
[im 90/147  bone]
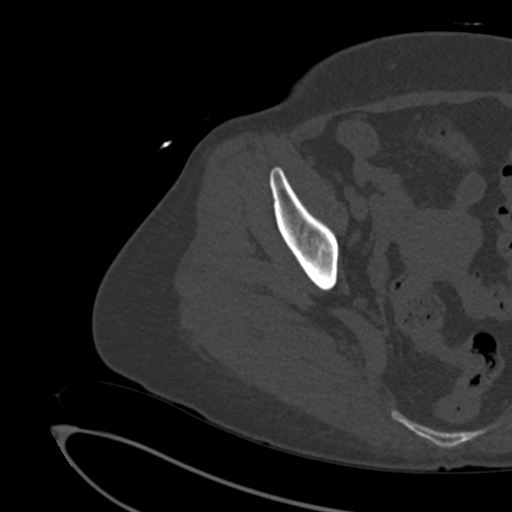
[im 99/147  soft-tissue]
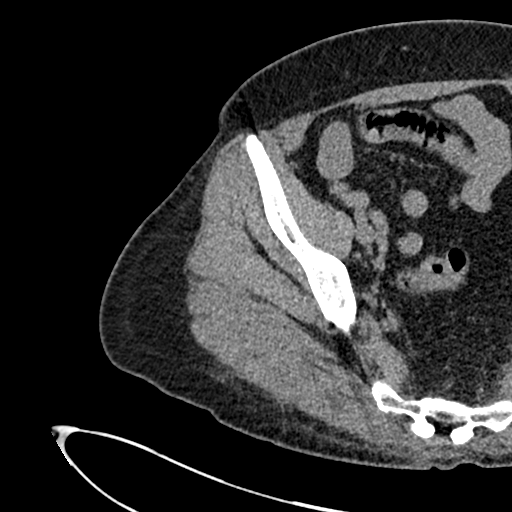
[im 109/147  soft-tissue]
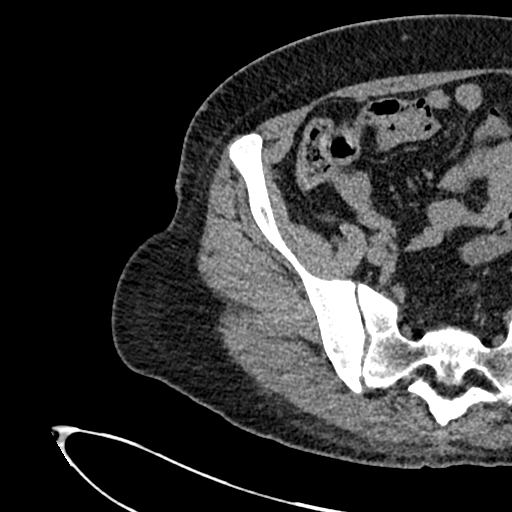
[im 118/147  soft-tissue]
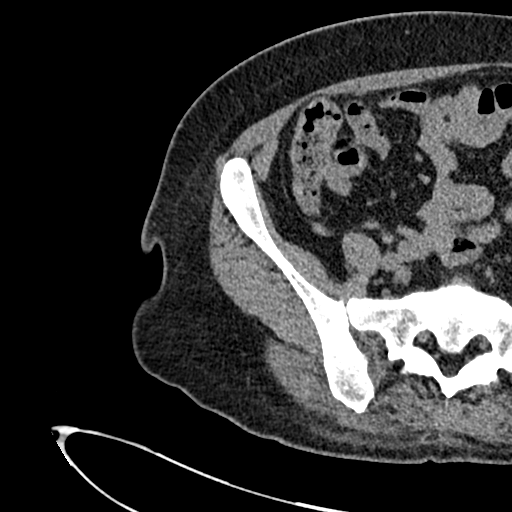
[im 128/147  soft-tissue]
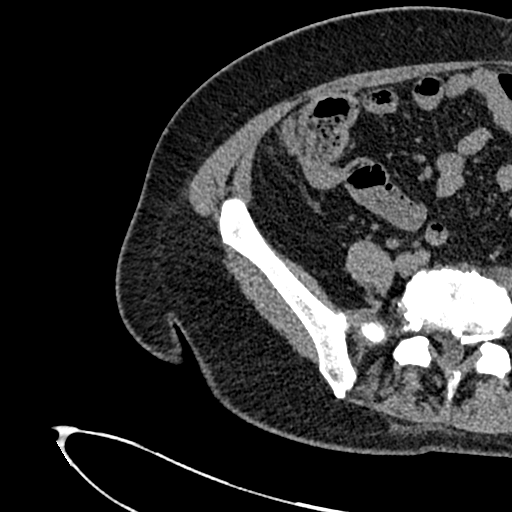
[im 137/147  soft-tissue]
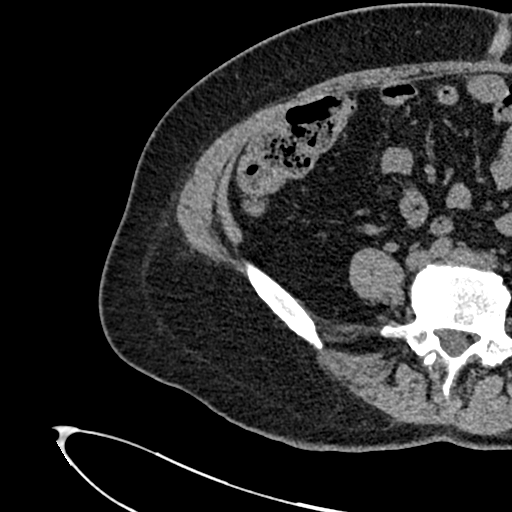

[Series 6: cor soft · coronal · 0.58mm/px · 3 of 131 slices shown]
[im 44/131  soft-tissue]
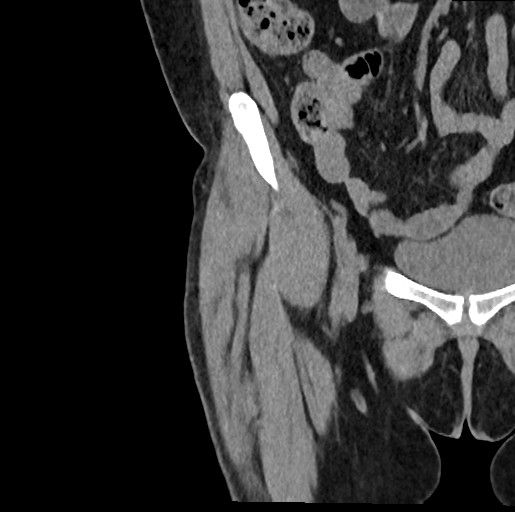
[im 58/131  soft-tissue]
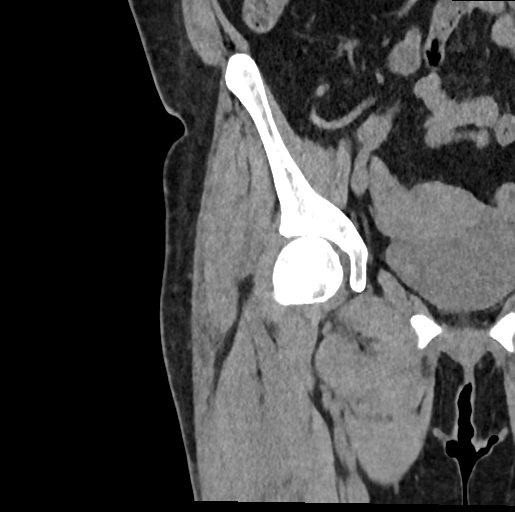
[im 73/131  soft-tissue]
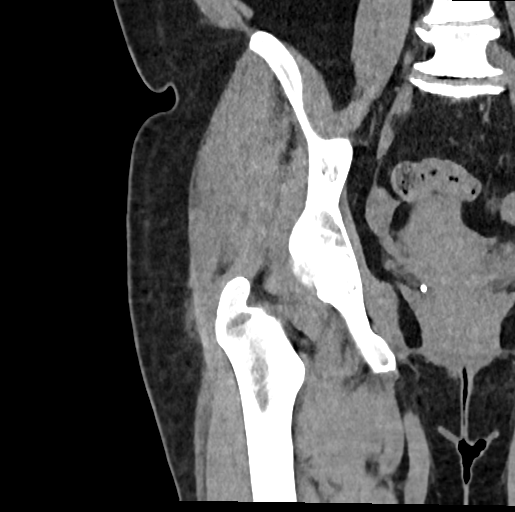

[17 of 46 positions shown; findings below may reference images not displayed]

FINDINGS: Bones/Joint/Cartilage

No acute fracture or dislocation. Joint spaces maintained at the
right hip. Degenerative changes are noted in the lower lumbar spine.

Ligaments

Suboptimally assessed by CT.

Muscles and Tendons

Edema is noted in the gluteal muscles on the right laterally without
evidence of significant intramuscular hematoma.

Soft tissues

Minimal subcutaneous fat stranding is present along the lateral
aspect of the right hip, possible contusion given history of trauma.
No significant subcutaneous hematoma.
IMPRESSION: No acute fracture or dislocation.

## 2022-12-30 DIAGNOSIS — E782 Mixed hyperlipidemia: Secondary | ICD-10-CM | POA: Diagnosis not present

## 2022-12-30 DIAGNOSIS — Z0000 Encounter for general adult medical examination without abnormal findings: Secondary | ICD-10-CM | POA: Diagnosis not present

## 2022-12-30 DIAGNOSIS — I10 Essential (primary) hypertension: Secondary | ICD-10-CM | POA: Diagnosis not present

## 2022-12-30 DIAGNOSIS — R202 Paresthesia of skin: Secondary | ICD-10-CM | POA: Diagnosis not present

## 2022-12-30 DIAGNOSIS — J301 Allergic rhinitis due to pollen: Secondary | ICD-10-CM | POA: Diagnosis not present

## 2022-12-30 DIAGNOSIS — E1165 Type 2 diabetes mellitus with hyperglycemia: Secondary | ICD-10-CM | POA: Diagnosis not present

## 2022-12-30 DIAGNOSIS — D329 Benign neoplasm of meninges, unspecified: Secondary | ICD-10-CM | POA: Diagnosis not present

## 2023-02-07 IMAGING — CT CT HEAD W/O CM
4 series · 17 of 47 positions shown, 19 images · non-contrast
Comparison: 09/26/2017.

CLINICAL DATA: Weakness.



[Series 2: head wo · axial · 0.41mm/px · z∈[-98,+22]mm · 7 of 34 slices shown, 9 images]
[im 5/34  brain]
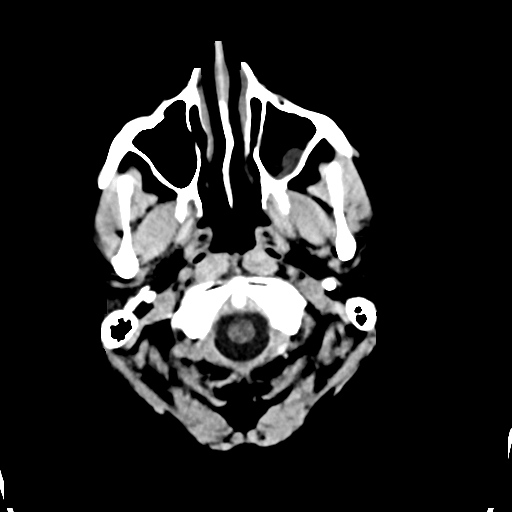
[im 5/34  bone]
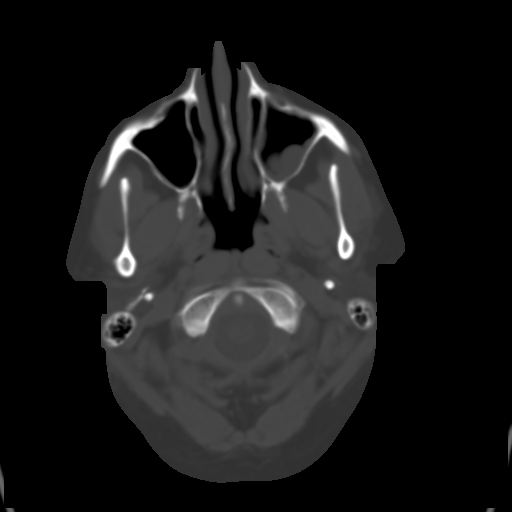
[im 9/34  brain]
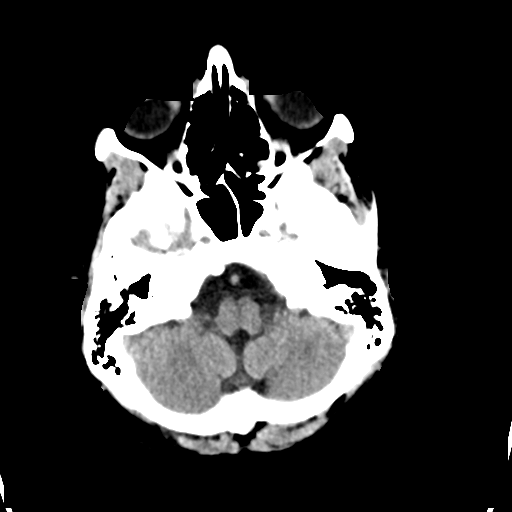
[im 13/34  brain]
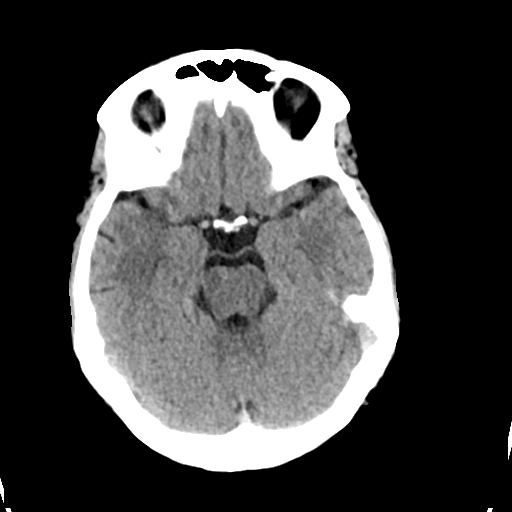
[im 17/34  brain]
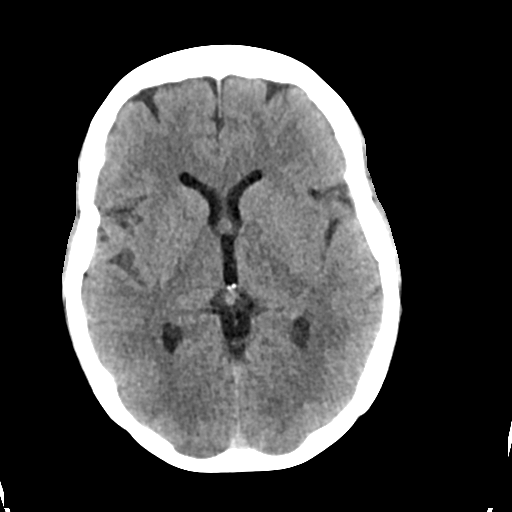
[im 21/34  brain]
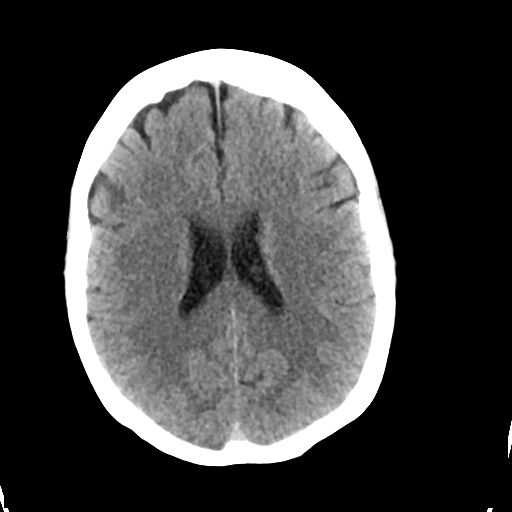
[im 21/34  bone]
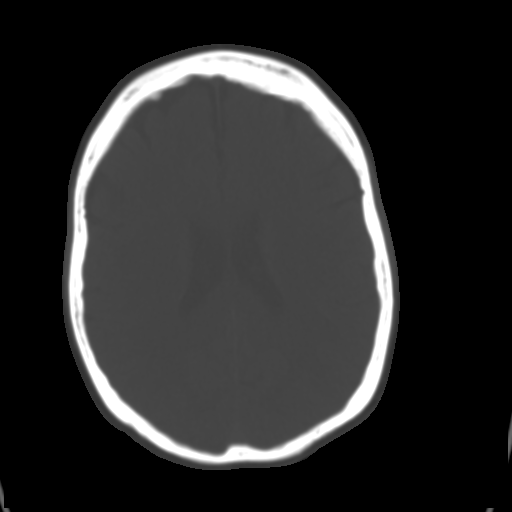
[im 25/34  brain]
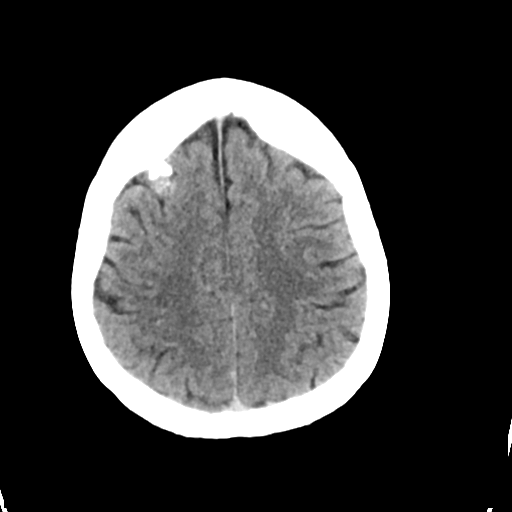
[im 29/34  brain]
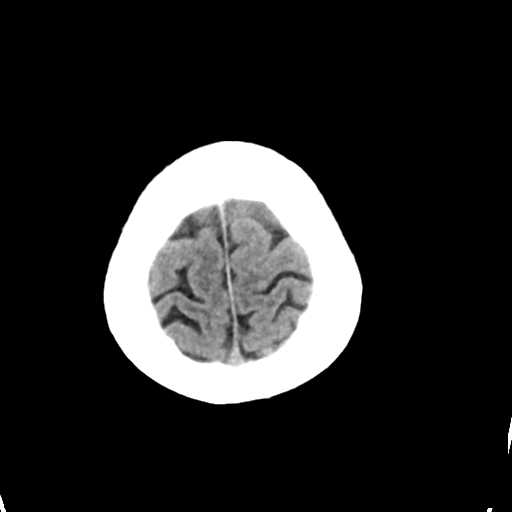

[Series 3: head bone · axial · 0.41mm/px · z∈[-102,-46]mm · 4 of 83 slices shown]
[im 9/83  bone]
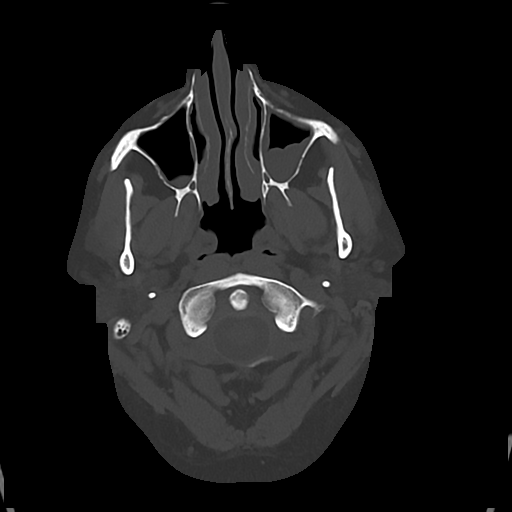
[im 17/83  bone]
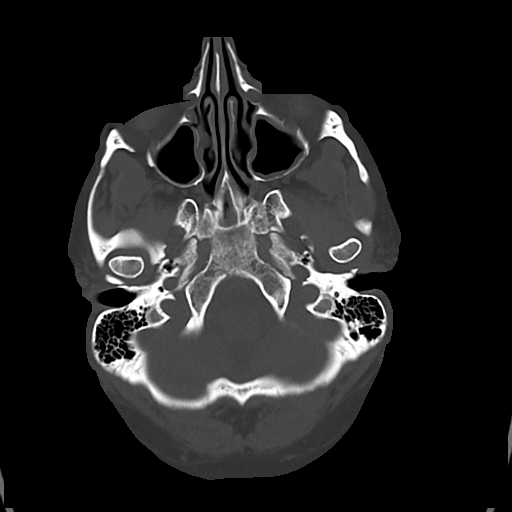
[im 25/83  bone]
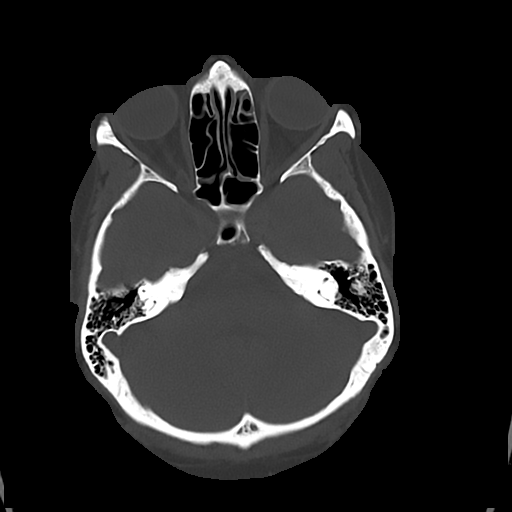
[im 37/83  bone]
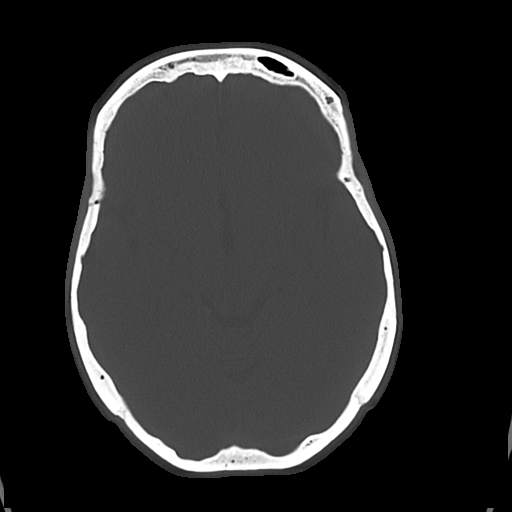

[Series 4: cor soft · coronal · 0.34mm/px · 3 of 69 slices shown]
[im 23/69  brain]
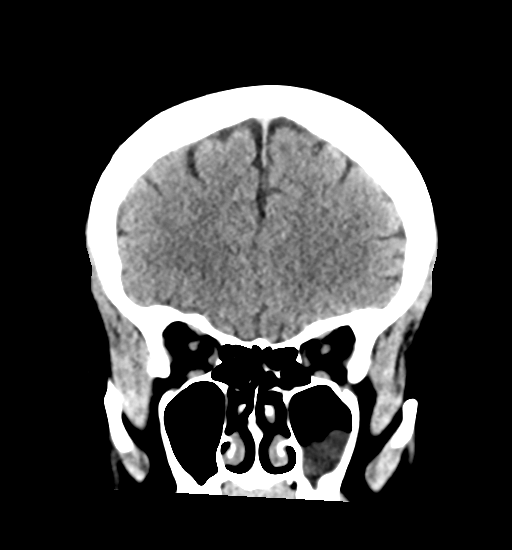
[im 31/69  brain]
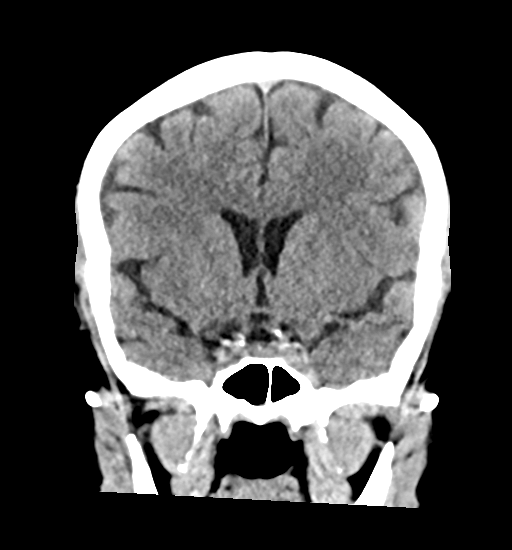
[im 38/69  brain]
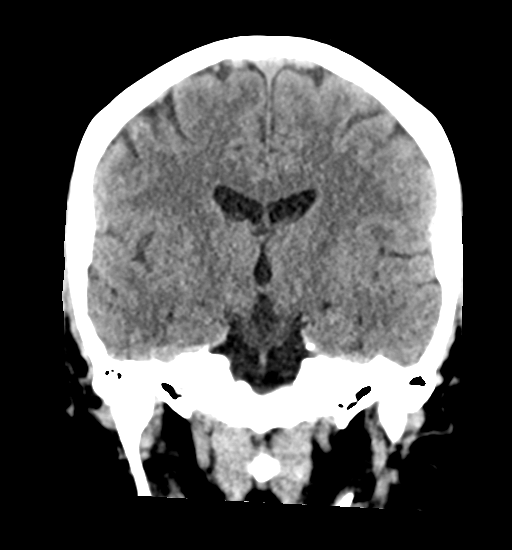

[Series 5: sag soft · sagittal · 0.37mm/px · 3 of 58 slices shown]
[im 20/58  brain]
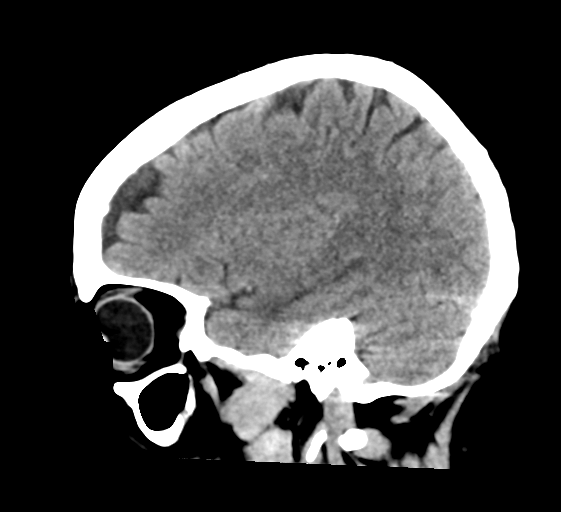
[im 29/58  brain]
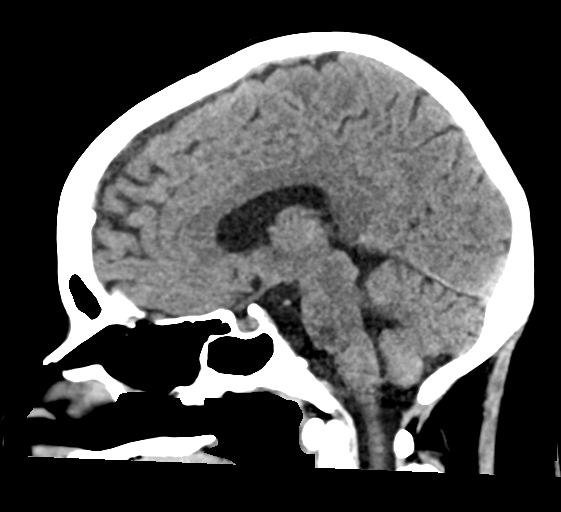
[im 39/58  brain]
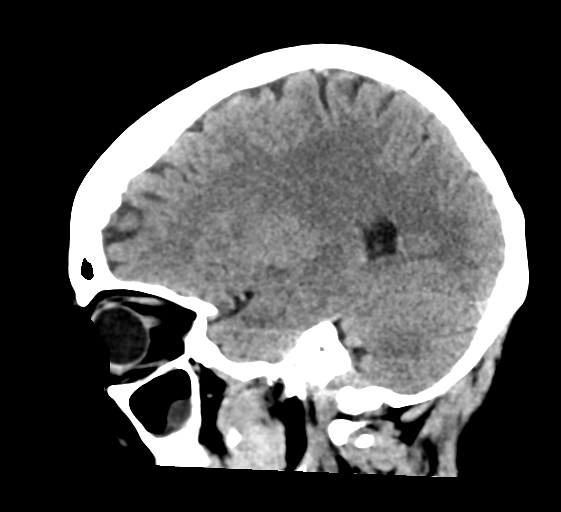

[17 of 47 positions shown; findings below may reference images not displayed]

FINDINGS: Brain: No evidence of acute infarction, hemorrhage, hydrocephalus,
extra-axial collection or mass lesion/mass effect.

Bony protuberance from the inner table of the superior right frontal
bone is stable.

Vascular: No hyperdense vessel or unexpected calcification.

Skull: No fracture.  No suspicious bone lesion.

Sinuses/Orbits: Globes and orbits are unremarkable. Mild left
maxillary sinus mucosal thickening. Minor sphenoid, right maxillary
sinus and scattered ethmoid sinus mucosal thickening.

Other: None.
IMPRESSION: 1. No acute intracranial abnormalities.

## 2023-03-31 ENCOUNTER — Encounter (HOSPITAL_COMMUNITY): Payer: Self-pay

## 2023-03-31 ENCOUNTER — Ambulatory Visit (HOSPITAL_COMMUNITY): Payer: 59

## 2023-03-31 ENCOUNTER — Ambulatory Visit (HOSPITAL_COMMUNITY)
Admission: EM | Admit: 2023-03-31 | Discharge: 2023-03-31 | Disposition: A | Discharge status: Home / Self Care | Payer: 59 | Attending: Internal Medicine | Admitting: Internal Medicine

## 2023-03-31 DIAGNOSIS — J329 Chronic sinusitis, unspecified: Secondary | ICD-10-CM

## 2023-03-31 DIAGNOSIS — J40 Bronchitis, not specified as acute or chronic: Secondary | ICD-10-CM | POA: Diagnosis not present

## 2023-03-31 DIAGNOSIS — R058 Other specified cough: Secondary | ICD-10-CM | POA: Diagnosis not present

## 2023-03-31 DIAGNOSIS — R0602 Shortness of breath: Secondary | ICD-10-CM

## 2023-03-31 MED ORDER — DEXAMETHASONE SODIUM PHOSPHATE 10 MG/ML IJ SOLN
10.0000 mg | Freq: Once | INTRAMUSCULAR | Status: AC
  Administered 2023-03-31: 10 mg via INTRAMUSCULAR

## 2023-03-31 MED ORDER — BENZONATATE 100 MG PO CAPS: 100.0000 mg | ORAL_CAPSULE | Freq: Three times a day (TID) | ORAL | 0 refills | Status: AC

## 2023-03-31 MED ORDER — DEXAMETHASONE SODIUM PHOSPHATE 10 MG/ML IJ SOLN
INTRAMUSCULAR | Status: AC
  Filled 2023-03-31: qty 1

## 2023-03-31 MED ORDER — ALBUTEROL SULFATE HFA 108 (90 BASE) MCG/ACT IN AERS
1.0000 | INHALATION_SPRAY | Freq: Four times a day (QID) | RESPIRATORY_TRACT | 0 refills | Status: AC | PRN
Start: 2023-03-31 — End: ?

## 2023-03-31 MED ORDER — CETIRIZINE HCL 10 MG PO TABS: 10.0000 mg | ORAL_TABLET | Freq: Every day | ORAL | 0 refills | Status: AC

## 2023-03-31 MED ORDER — PREDNISONE 20 MG PO TABS: 40.0000 mg | ORAL_TABLET | Freq: Every day | ORAL | 0 refills | Status: AC

## 2023-03-31 MED ORDER — ALBUTEROL SULFATE (2.5 MG/3ML) 0.083% IN NEBU
2.5000 mg | INHALATION_SOLUTION | Freq: Once | RESPIRATORY_TRACT | Status: AC
  Administered 2023-03-31: 2.5 mg via RESPIRATORY_TRACT

## 2023-03-31 MED ORDER — FLUCONAZOLE 150 MG PO TABS: 150.0000 mg | ORAL_TABLET | ORAL | 0 refills | Status: AC

## 2023-03-31 MED ORDER — AMOXICILLIN-POT CLAVULANATE 875-125 MG PO TABS: 1.0000 | ORAL_TABLET | Freq: Two times a day (BID) | ORAL | 0 refills | Status: AC

## 2023-03-31 MED ORDER — ALBUTEROL SULFATE (2.5 MG/3ML) 0.083% IN NEBU
INHALATION_SOLUTION | RESPIRATORY_TRACT | Status: AC
  Filled 2023-03-31: qty 3

## 2023-03-31 NOTE — Discharge Instructions (Signed)
You have bronchitis which is inflammation of the upper airways in your lungs due to a virus. The following medicines will help with your symptoms.   - Take steroid pills sent to pharmacy as directed. Do not take any other NSAID containing medications such as ibuprofen or naproxen/Aleve while taking prednisone. - You may use albuterol inhaler 1 to 2 puffs every 4-6 hours as needed for cough, shortness of breath, and wheezing. - Take cough medicines as prescribed.  - Continue using over the counter medicines as needed as directed. Plain mucinex (guaifenesin) over the counter may further help breakup mucus and help with symptoms.   If you develop any new or worsening symptoms or do not improve in the next 2 to 3 days, please return.  If your symptoms are severe, please go to the emergency room. Follow-up with PCP as needed.

## 2023-03-31 NOTE — ED Triage Notes (Signed)
Pt c/o productive cough with clear to greenish sputum, congestion, and headache x1wk. States taking OTC meds with little relief.

## 2023-03-31 NOTE — ED Provider Notes (Signed)
[facility]    CSN: [id number] Arrival date & time: 03/31/23  2952      History   Chief Complaint Chief Complaint  Patient presents with   Cough    HPI Diamond Brown is a 52 y.o. female.   Diamond Brown is a 52 y.o. female presenting for chief complaint of Cough that started approximately 1 week ago.  She reports sore throat, nasal congestion, intermittent headaches, and chills as well.  Her voice became hoarse yesterday.  Denies difficulty swallowing or maintaining secretions.  Reports shortness of breath associated with coughing fits and chest discomfort associated with coughing.  No nausea, vomiting, diarrhea, abdominal pain, rash, or recent sick contacts with similar symptoms.  Denies history of chronic respiratory problems.  Never smoker, denies drug use.  She is taking over-the-counter medications for symptomatic relief without help.   Cough   Past Medical History:  Diagnosis Date   Agoraphobia    Anxiety    Brain tumor (benign) (HCC)    Diabetes mellitus without complication (HCC)    Dysarthria    High cholesterol    Hypertension    Meningioma (HCC)    Panic disorder    PTSD (post-traumatic stress disorder)    Restless leg syndrome    Vertigo     There are no problems to display for this patient.   Past Surgical History:  Procedure Laterality Date   BACK SURGERY     PARTIAL HYSTERECTOMY      OB History     Gravida      Para      Term      Preterm      AB      Living  2      SAB      IAB      Ectopic      Multiple      Live Births               Home Medications    Prior to Admission medications   Medication Sig Start Date End Date Taking? Authorizing Provider  albuterol (VENTOLIN HFA) 108 (90 Base) MCG/ACT inhaler Inhale 1-2 puffs into the lungs every 6 (six) hours as needed for wheezing or shortness of breath. 03/31/23  Yes Carlisle Beers, FNP  amoxicillin-clavulanate (AUGMENTIN) 875-125 MG tablet  Take 1 tablet by mouth every 12 (twelve) hours. 03/31/23  Yes Carlisle Beers, FNP  benzonatate (TESSALON) 100 MG capsule Take 1 capsule (100 mg total) by mouth every 8 (eight) hours. 03/31/23  Yes Carlisle Beers, FNP  cetirizine (ZYRTEC ALLERGY) 10 MG tablet Take 1 tablet (10 mg total) by mouth daily. 03/31/23  Yes Carlisle Beers, FNP  fluconazole (DIFLUCAN) 150 MG tablet Take 1 tablet (150 mg total) by mouth every 3 (three) days. 03/31/23  Yes Carlisle Beers, FNP  predniSONE (DELTASONE) 20 MG tablet Take 2 tablets (40 mg total) by mouth daily for 5 days. 03/31/23 04/05/23 Yes Carlisle Beers, FNP  acebutolol (SECTRAL) 200 MG capsule Take 200 mg by mouth every evening.  08/31/19   [provider]  atenolol (TENORMIN) 50 MG tablet Take 50 mg by mouth daily.    [provider]  bimatoprost (LUMIGAN) 0.03 % ophthalmic solution Place 1 drop into both eyes at bedtime.    [provider]  brimonidine-timolol (COMBIGAN) 0.2-0.5 % ophthalmic solution Place 1 drop into both eyes every 12 (twelve) hours.    [provider]  lisinopril (ZESTRIL) 10 MG tablet Take 10 mg by mouth at bedtime. 10/25/19   [provider]  lovastatin (MEVACOR) 40 MG tablet Take 40 mg by mouth at bedtime. 10/25/19   [provider]  metFORMIN (GLUCOPHAGE-XR) 500 MG 24 hr tablet Take 1,000 mg by mouth 2 (two) times daily. 10/25/19   [provider]  ondansetron (ZOFRAN) 4 MG tablet Take 4 mg by mouth 2 (two) times daily as needed for nausea or vomiting.  10/31/19   [provider]    Family History Family History  Problem Relation Age of Onset   Cancer Mother    Hypertension Mother    Diabetes Mother    Glaucoma Father    Heart failure Father    Diabetes Sister    Diabetes Brother     Social History Social History   Tobacco Use   Smoking status: Some Days    Types: Cigarettes   Smokeless tobacco: Never  Vaping Use    Vaping status: Never Used  Substance Use Topics   Alcohol use: Yes    Comment: Social   Drug use: No     Allergies   Fanapt [iloperidone]   Review of Systems Review of Systems  Respiratory:  Positive for cough.   Per HPI   Physical Exam Triage Vital Signs ED Triage Vitals [03/31/23 0843]  Encounter Vitals Group     BP (!) 144/83     Systolic BP Percentile      Diastolic BP Percentile      Pulse Rate 82     Resp 18     Temp (!) 97.4 F (36.3 C)     Temp Source Oral     SpO2 98 %     Weight      Height      Head Circumference      Peak Flow      Pain Score 0     Pain Loc      Pain Education      Exclude from Growth Chart    No data found.  Updated Vital Signs BP (!) 144/83 (BP Location: Right Arm)   Pulse 82   Temp (!) 97.4 F (36.3 C) (Oral)   Resp 18   LMP 03/27/2023   SpO2 98%   Visual Acuity Right Eye Distance:   Left Eye Distance:   Bilateral Distance:    Right Eye Near:   Left Eye Near:    Bilateral Near:     Physical Exam Vitals and nursing note reviewed.  Constitutional:      Appearance: She is ill-appearing. She is not toxic-appearing.  HENT:     Head: Normocephalic and atraumatic.     Right Ear: Hearing, tympanic membrane, ear canal and external ear normal.     Left Ear: Hearing, tympanic membrane, ear canal and external ear normal.     Nose: Congestion present.     Right Sinus: Maxillary sinus tenderness present.     Left Sinus: Maxillary sinus tenderness present.     Mouth/Throat:     Lips: Pink.     Mouth: Mucous membranes are moist. No injury.     Tongue: No lesions. Tongue does not deviate from midline.     Palate: No mass and lesions.     Pharynx: Oropharynx is clear. Uvula midline. Posterior oropharyngeal erythema present. No pharyngeal swelling, oropharyngeal exudate or uvula swelling.     Tonsils: No tonsillar exudate or tonsillar abscesses.  Eyes:  General: Lids are normal. Vision grossly intact. Gaze aligned  appropriately.     Extraocular Movements: Extraocular movements intact.     Conjunctiva/sclera: Conjunctivae normal.  Cardiovascular:     Rate and Rhythm: Normal rate and regular rhythm.     Heart sounds: Normal heart sounds, S1 normal and S2 normal.  Pulmonary:     Effort: Pulmonary effort is normal. No respiratory distress.     Breath sounds: Normal air entry. Wheezing and rhonchi present. No rales.     Comments: Wheezing and rhonchi heard to the bilateral lower lung fields. Speaking in full sentences without difficulty. Chest:     Chest wall: No tenderness.  Musculoskeletal:     Cervical back: Neck supple.  Lymphadenopathy:     Cervical: Cervical adenopathy present.  Skin:    General: Skin is warm and dry.     Capillary Refill: Capillary refill takes less than 2 seconds.     Findings: No rash.  Neurological:     General: No focal deficit present.     Mental Status: She is alert and oriented to person, place, and time. Mental status is at baseline.     Cranial Nerves: No dysarthria or facial asymmetry.  Psychiatric:        Mood and Affect: Mood normal.        Speech: Speech normal.        Behavior: Behavior normal.        Thought Content: Thought content normal.        Judgment: Judgment normal.      UC Treatments / Results  Labs (all labs ordered are listed, but only abnormal results are displayed) Labs Reviewed - No data to display  EKG   Radiology DG Chest 2 View  Result Date: 03/31/2023 CLINICAL DATA:  Productive cough. EXAM: CHEST - 2 VIEW COMPARISON:  Oct 18, 2022. FINDINGS: The heart size and mediastinal contours are within normal limits. Both lungs are clear. The visualized skeletal structures are unremarkable. IMPRESSION: No active cardiopulmonary disease. Electronically Signed   By: Lupita Raider M.D.   On: 03/31/2023 12:19    Procedures Procedures (including critical care time)  Medications Ordered in UC Medications  albuterol (PROVENTIL) (2.5  MG/3ML) 0.083% nebulizer solution 2.5 mg (2.5 mg Nebulization Given 03/31/23 0935)  dexamethasone (DECADRON) injection 10 mg (10 mg Intramuscular Given 03/31/23 0935)    Initial Impression / Assessment and Plan / UC Course  I have reviewed the triage vital signs and the nursing notes.  Pertinent labs & imaging results that were available during my care of the patient were reviewed by me and considered in my medical decision making (see chart for details).   1. Sinobronchitis, shortness of breath Evaluation suggests sinobronchitis etiology.  Deferred imaging based on stable cardiopulmonary exam, hemodynamically stable vital signs, and low suspicion for pneumonia/focal consolidation. Symptoms have been present for greater than 7 days, therefore will treat with Augmentin antibiotic as prescribed and short course of steroid. No NSAID with steroid, advised to take with food. Requesting prophylactic treatment for yeast vaginitis associated with antibiotic use.  Diflucan sent. Albuterol inhaler as needed.  Prescriptions sent for further symptomatic relief, may continue OTC medications as needed.   Counseled patient on potential for adverse effects with medications prescribed/recommended today, strict ER and return-to-clinic precautions discussed, patient verbalized understanding.    Final Clinical Impressions(s) / UC Diagnoses   Final diagnoses:  Sinobronchitis  Shortness of breath     Discharge Instructions  You have bronchitis which is inflammation of the upper airways in your lungs due to a virus. The following medicines will help with your symptoms.   - Take steroid pills sent to pharmacy as directed. Do not take any other NSAID containing medications such as ibuprofen or naproxen/Aleve while taking prednisone. - You may use albuterol inhaler 1 to 2 puffs every 4-6 hours as needed for cough, shortness of breath, and wheezing. - Take cough medicines as prescribed.  - Continue  using over the counter medicines as needed as directed. Plain mucinex (guaifenesin) over the counter may further help breakup mucus and help with symptoms.   If you develop any new or worsening symptoms or do not improve in the next 2 to 3 days, please return.  If your symptoms are severe, please go to the emergency room. Follow-up with PCP as needed.      ED Prescriptions     Medication Sig Dispense Auth. Provider   predniSONE (DELTASONE) 20 MG tablet Take 2 tablets (40 mg total) by mouth daily for 5 days. 10 tablet Reita May M, FNP   benzonatate (TESSALON) 100 MG capsule Take 1 capsule (100 mg total) by mouth every 8 (eight) hours. 21 capsule Reita May M, FNP   albuterol (VENTOLIN HFA) 108 (90 Base) MCG/ACT inhaler Inhale 1-2 puffs into the lungs every 6 (six) hours as needed for wheezing or shortness of breath. 8 g Reita May M, FNP   cetirizine (ZYRTEC ALLERGY) 10 MG tablet Take 1 tablet (10 mg total) by mouth daily. 30 tablet Carlisle Beers, FNP   amoxicillin-clavulanate (AUGMENTIN) 875-125 MG tablet Take 1 tablet by mouth every 12 (twelve) hours. 14 tablet Reita May M, FNP   fluconazole (DIFLUCAN) 150 MG tablet Take 1 tablet (150 mg total) by mouth every 3 (three) days. 2 tablet Carlisle Beers, FNP      PDMP not reviewed this encounter.   Carlisle Beers, [place] 03/31/23 1644

## 2023-06-02 DIAGNOSIS — E1165 Type 2 diabetes mellitus with hyperglycemia: Secondary | ICD-10-CM | POA: Diagnosis not present

## 2023-06-02 DIAGNOSIS — I10 Essential (primary) hypertension: Secondary | ICD-10-CM | POA: Diagnosis not present

## 2023-06-02 DIAGNOSIS — E782 Mixed hyperlipidemia: Secondary | ICD-10-CM | POA: Diagnosis not present

## 2023-06-02 DIAGNOSIS — Z0000 Encounter for general adult medical examination without abnormal findings: Secondary | ICD-10-CM | POA: Diagnosis not present

## 2023-06-09 DIAGNOSIS — E1165 Type 2 diabetes mellitus with hyperglycemia: Secondary | ICD-10-CM | POA: Diagnosis not present

## 2023-06-09 DIAGNOSIS — I10 Essential (primary) hypertension: Secondary | ICD-10-CM | POA: Diagnosis not present

## 2023-06-09 DIAGNOSIS — E782 Mixed hyperlipidemia: Secondary | ICD-10-CM | POA: Diagnosis not present

## 2023-07-11 DIAGNOSIS — H401132 Primary open-angle glaucoma, bilateral, moderate stage: Secondary | ICD-10-CM | POA: Diagnosis not present

## 2023-07-11 DIAGNOSIS — E119 Type 2 diabetes mellitus without complications: Secondary | ICD-10-CM | POA: Diagnosis not present

## 2023-07-11 DIAGNOSIS — I10 Essential (primary) hypertension: Secondary | ICD-10-CM | POA: Diagnosis not present

## 2023-07-11 DIAGNOSIS — H2513 Age-related nuclear cataract, bilateral: Secondary | ICD-10-CM | POA: Diagnosis not present

## 2023-09-11 DIAGNOSIS — J029 Acute pharyngitis, unspecified: Secondary | ICD-10-CM | POA: Diagnosis not present

## 2023-09-11 DIAGNOSIS — R6883 Chills (without fever): Secondary | ICD-10-CM | POA: Diagnosis not present

## 2023-09-11 DIAGNOSIS — R1110 Vomiting, unspecified: Secondary | ICD-10-CM | POA: Diagnosis not present

## 2023-09-11 DIAGNOSIS — R519 Headache, unspecified: Secondary | ICD-10-CM | POA: Diagnosis not present

## 2023-09-11 DIAGNOSIS — R197 Diarrhea, unspecified: Secondary | ICD-10-CM | POA: Diagnosis not present

## 2023-09-27 DIAGNOSIS — D225 Melanocytic nevi of trunk: Secondary | ICD-10-CM | POA: Diagnosis not present

## 2023-09-27 DIAGNOSIS — Z1283 Encounter for screening for malignant neoplasm of skin: Secondary | ICD-10-CM | POA: Diagnosis not present

## 2023-10-04 DIAGNOSIS — Z1231 Encounter for screening mammogram for malignant neoplasm of breast: Secondary | ICD-10-CM | POA: Diagnosis not present

## 2023-10-04 DIAGNOSIS — Z0000 Encounter for general adult medical examination without abnormal findings: Secondary | ICD-10-CM | POA: Diagnosis not present

## 2024-01-05 DIAGNOSIS — E1165 Type 2 diabetes mellitus with hyperglycemia: Secondary | ICD-10-CM | POA: Diagnosis not present

## 2024-01-05 DIAGNOSIS — E782 Mixed hyperlipidemia: Secondary | ICD-10-CM | POA: Diagnosis not present

## 2024-01-05 DIAGNOSIS — R5383 Other fatigue: Secondary | ICD-10-CM | POA: Diagnosis not present

## 2024-01-05 DIAGNOSIS — I10 Essential (primary) hypertension: Secondary | ICD-10-CM | POA: Diagnosis not present

## 2024-01-10 DIAGNOSIS — L089 Local infection of the skin and subcutaneous tissue, unspecified: Secondary | ICD-10-CM | POA: Diagnosis not present

## 2024-01-10 DIAGNOSIS — S01131A Puncture wound without foreign body of right eyelid and periocular area, initial encounter: Secondary | ICD-10-CM | POA: Diagnosis not present

## 2024-01-10 DIAGNOSIS — X58XXXA Exposure to other specified factors, initial encounter: Secondary | ICD-10-CM | POA: Diagnosis not present

## 2024-01-12 DIAGNOSIS — Z0000 Encounter for general adult medical examination without abnormal findings: Secondary | ICD-10-CM | POA: Diagnosis not present

## 2024-01-12 DIAGNOSIS — R202 Paresthesia of skin: Secondary | ICD-10-CM | POA: Diagnosis not present

## 2024-01-12 DIAGNOSIS — J301 Allergic rhinitis due to pollen: Secondary | ICD-10-CM | POA: Diagnosis not present

## 2024-01-12 DIAGNOSIS — I10 Essential (primary) hypertension: Secondary | ICD-10-CM | POA: Diagnosis not present

## 2024-01-12 DIAGNOSIS — E782 Mixed hyperlipidemia: Secondary | ICD-10-CM | POA: Diagnosis not present

## 2024-01-12 DIAGNOSIS — E1165 Type 2 diabetes mellitus with hyperglycemia: Secondary | ICD-10-CM | POA: Diagnosis not present

## 2024-01-12 DIAGNOSIS — Z634 Disappearance and death of family member: Secondary | ICD-10-CM | POA: Diagnosis not present

## 2024-01-12 DIAGNOSIS — D329 Benign neoplasm of meninges, unspecified: Secondary | ICD-10-CM | POA: Diagnosis not present

## 2024-02-28 DIAGNOSIS — J069 Acute upper respiratory infection, unspecified: Secondary | ICD-10-CM | POA: Diagnosis not present

## 2024-02-28 DIAGNOSIS — R0981 Nasal congestion: Secondary | ICD-10-CM | POA: Diagnosis not present

## 2024-02-28 DIAGNOSIS — R059 Cough, unspecified: Secondary | ICD-10-CM | POA: Diagnosis not present

## 2024-02-28 DIAGNOSIS — I10 Essential (primary) hypertension: Secondary | ICD-10-CM | POA: Diagnosis not present
